# Patient Record
Sex: Female | Born: 1970 | Race: White | Hispanic: No | Marital: Married | Smoking: Never smoker
Health system: Southern US, Community
[De-identification: ages and names within clinical notes are randomized; demographics above are authoritative.]

## PROBLEM LIST (undated history)

## (undated) DIAGNOSIS — R739 Hyperglycemia, unspecified: Secondary | ICD-10-CM

## (undated) DIAGNOSIS — R251 Tremor, unspecified: Secondary | ICD-10-CM

## (undated) HISTORY — DX: Hyperglycemia, unspecified: R73.9

## (undated) HISTORY — PX: CHOLECYSTECTOMY: SHX55

## (undated) HISTORY — DX: Tremor, unspecified: R25.1

---

## 2000-05-13 ENCOUNTER — Other Ambulatory Visit: Admission: RE | Admit: 2000-05-13 | Discharge: 2000-05-13 | Payer: Self-pay | Admitting: Internal Medicine

## 2003-09-20 ENCOUNTER — Other Ambulatory Visit: Admission: RE | Admit: 2003-09-20 | Discharge: 2003-09-20 | Payer: Self-pay | Admitting: Internal Medicine

## 2003-09-29 ENCOUNTER — Encounter: Admission: RE | Admit: 2003-09-29 | Discharge: 2003-09-29 | Payer: Self-pay | Admitting: Internal Medicine

## 2004-04-20 ENCOUNTER — Other Ambulatory Visit: Admission: RE | Admit: 2004-04-20 | Discharge: 2004-04-20 | Payer: Self-pay | Admitting: Obstetrics and Gynecology

## 2004-05-16 ENCOUNTER — Ambulatory Visit: Payer: Self-pay | Admitting: Internal Medicine

## 2005-04-03 ENCOUNTER — Ambulatory Visit: Payer: Self-pay | Admitting: Internal Medicine

## 2005-05-09 ENCOUNTER — Other Ambulatory Visit: Admission: RE | Admit: 2005-05-09 | Discharge: 2005-05-09 | Payer: Self-pay | Admitting: Obstetrics and Gynecology

## 2005-10-31 ENCOUNTER — Inpatient Hospital Stay (HOSPITAL_COMMUNITY): Admission: AD | Admit: 2005-10-31 | Discharge: 2005-10-31 | Payer: Self-pay | Admitting: Obstetrics and Gynecology

## 2005-11-07 ENCOUNTER — Inpatient Hospital Stay (HOSPITAL_COMMUNITY): Admission: AD | Admit: 2005-11-07 | Discharge: 2005-11-10 | Payer: Self-pay | Admitting: Obstetrics and Gynecology

## 2006-12-08 ENCOUNTER — Ambulatory Visit: Payer: Self-pay | Admitting: Internal Medicine

## 2006-12-08 DIAGNOSIS — R519 Headache, unspecified: Secondary | ICD-10-CM | POA: Insufficient documentation

## 2006-12-08 DIAGNOSIS — R51 Headache: Secondary | ICD-10-CM | POA: Insufficient documentation

## 2006-12-08 DIAGNOSIS — R079 Chest pain, unspecified: Secondary | ICD-10-CM | POA: Insufficient documentation

## 2006-12-25 ENCOUNTER — Encounter: Admission: RE | Admit: 2006-12-25 | Discharge: 2006-12-25 | Payer: Self-pay | Admitting: Internal Medicine

## 2006-12-26 DIAGNOSIS — K8020 Calculus of gallbladder without cholecystitis without obstruction: Secondary | ICD-10-CM | POA: Insufficient documentation

## 2007-01-07 ENCOUNTER — Encounter: Payer: Self-pay | Admitting: Internal Medicine

## 2007-02-06 ENCOUNTER — Ambulatory Visit (HOSPITAL_COMMUNITY): Admission: RE | Admit: 2007-02-06 | Discharge: 2007-02-06 | Payer: Self-pay | Admitting: Surgery

## 2007-02-06 ENCOUNTER — Encounter (INDEPENDENT_AMBULATORY_CARE_PROVIDER_SITE_OTHER): Payer: Self-pay | Admitting: Surgery

## 2007-07-24 ENCOUNTER — Ambulatory Visit: Payer: Self-pay | Admitting: Internal Medicine

## 2007-07-24 LAB — CONVERTED CEMR LAB
Specific Gravity, Urine: 1.025
Urobilinogen, UA: 0.2
WBC Urine, dipstick: NEGATIVE

## 2007-07-26 LAB — CONVERTED CEMR LAB
ALT: 14 units/L (ref 0–35)
Albumin: 3.8 g/dL (ref 3.5–5.2)
Alkaline Phosphatase: 95 units/L (ref 39–117)
BUN: 13 mg/dL (ref 6–23)
Basophils Absolute: 0 10*3/uL (ref 0.0–0.1)
Basophils Relative: 0.2 % (ref 0.0–1.0)
CO2: 29 meq/L (ref 19–32)
Cholesterol: 162 mg/dL (ref 0–200)
Creatinine, Ser: 0.8 mg/dL (ref 0.4–1.2)
Eosinophils Relative: 0.7 % (ref 0.0–5.0)
GFR calc Af Amer: 104 mL/min
GFR calc non Af Amer: 86 mL/min
HCT: 41.1 % (ref 36.0–46.0)
Hemoglobin: 13.6 g/dL (ref 12.0–15.0)
MCHC: 33.1 g/dL (ref 30.0–36.0)
MCV: 87.5 fL (ref 78.0–100.0)
Monocytes Absolute: 0.5 10*3/uL (ref 0.1–1.0)
Monocytes Relative: 7.4 % (ref 3.0–12.0)
Neutrophils Relative %: 68.8 % (ref 43.0–77.0)
Platelets: 275 10*3/uL (ref 150–400)
RDW: 13.2 % (ref 11.5–14.6)
Total Protein: 7.2 g/dL (ref 6.0–8.3)
WBC: 6.1 10*3/uL (ref 4.5–10.5)

## 2007-08-04 ENCOUNTER — Encounter: Payer: Self-pay | Admitting: Internal Medicine

## 2007-08-04 ENCOUNTER — Other Ambulatory Visit: Admission: RE | Admit: 2007-08-04 | Discharge: 2007-08-04 | Payer: Self-pay | Admitting: Internal Medicine

## 2007-08-04 ENCOUNTER — Ambulatory Visit: Payer: Self-pay | Admitting: Internal Medicine

## 2007-09-30 ENCOUNTER — Ambulatory Visit: Payer: Self-pay | Admitting: Internal Medicine

## 2007-09-30 DIAGNOSIS — R42 Dizziness and giddiness: Secondary | ICD-10-CM | POA: Insufficient documentation

## 2007-12-31 IMAGING — US US ABDOMEN COMPLETE
1 series · 14 of 25 positions shown · non-contrast
Comparison: none

CLINICAL DATA: Epigastric pain. 
 ABDOMEN ULTRASOUND:
TECHNIQUE: Complete abdominal ultrasound examination was performed including evaluation of the liver, gallbladder, bile ducts, pancreas, kidneys, spleen, IVC, and abdominal aorta.

[Series 1: us abdomen complete · 0.20mm/px · 14 of 78 slices shown]
[im 1/78]
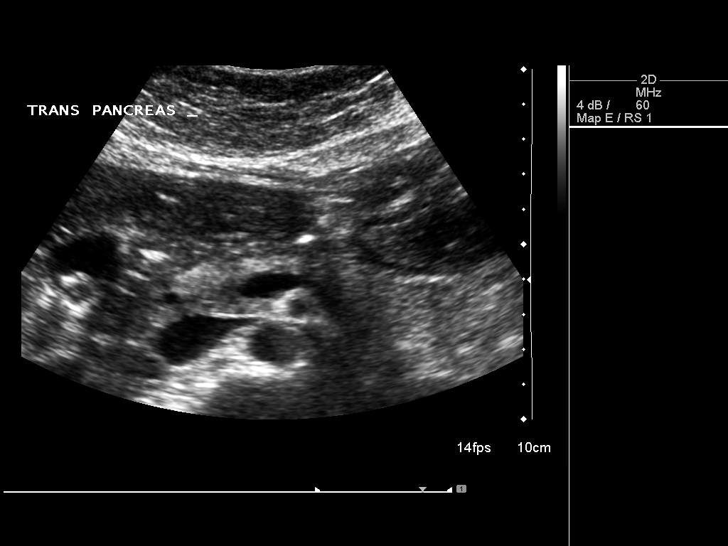
[im 7/78]
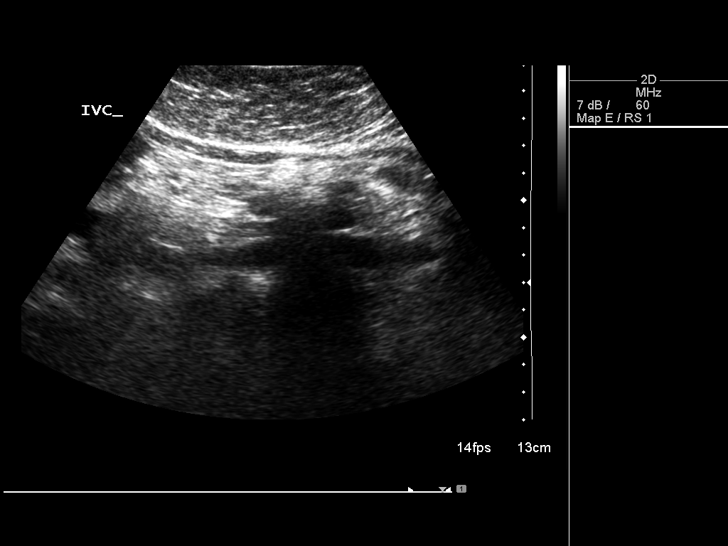
[im 13/78]
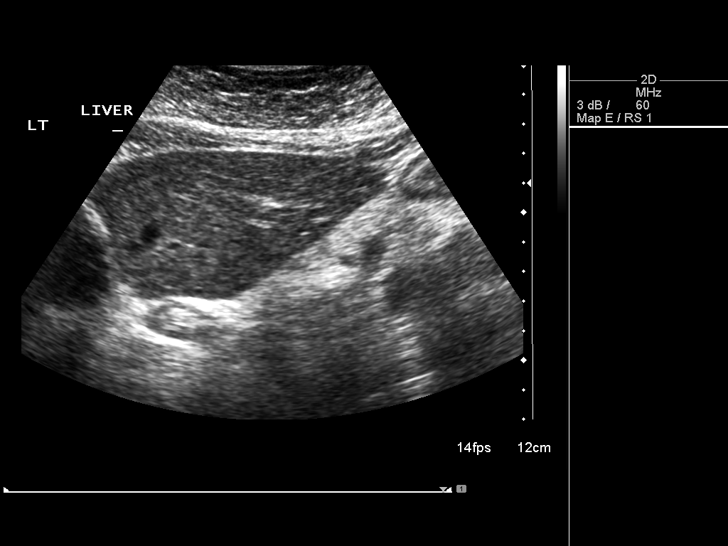
[im 20/78]
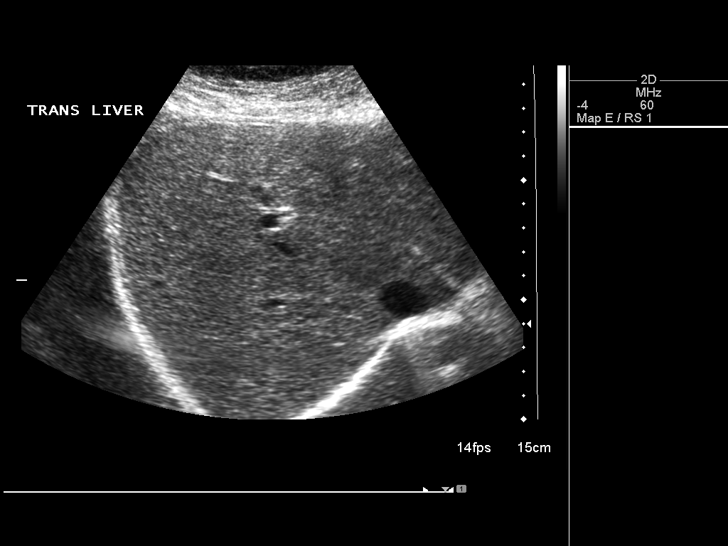
[im 26/78]
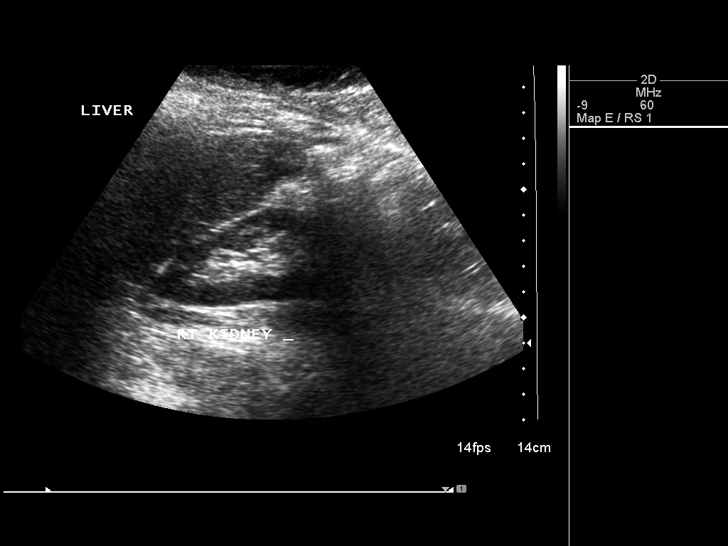
[im 29/78]
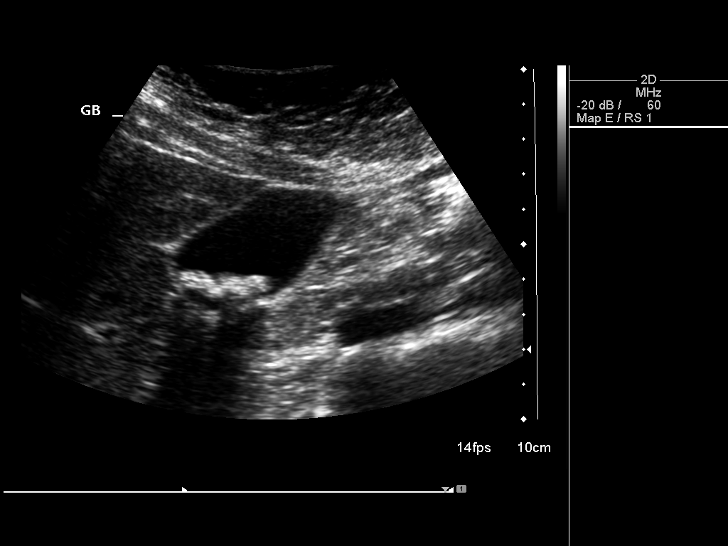
[im 36/78]
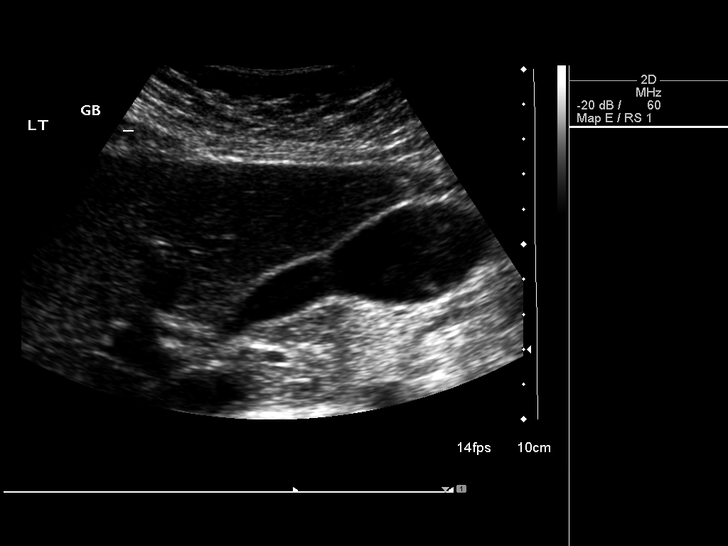
[im 42/78]
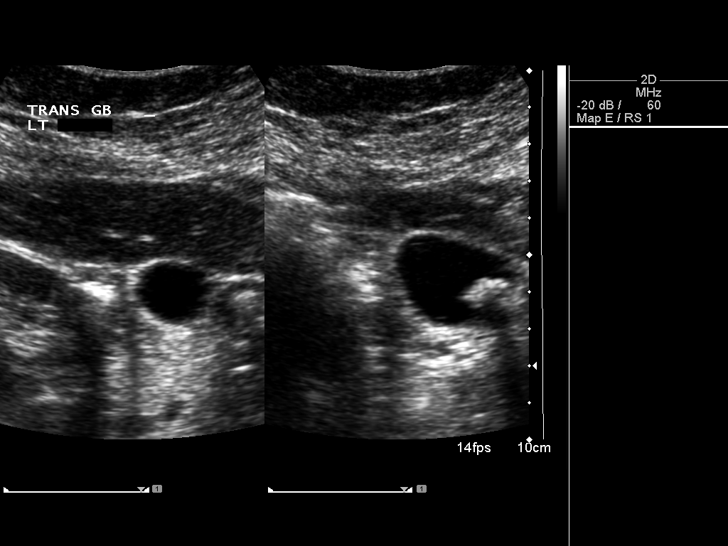
[im 49/78]
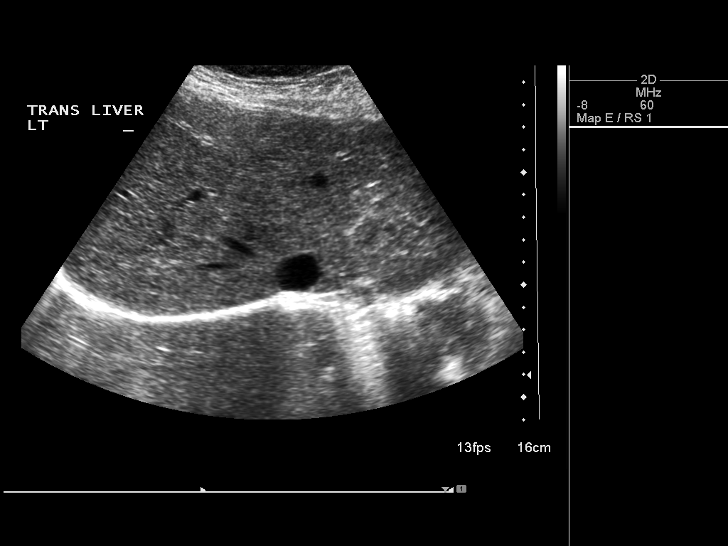
[im 52/78]
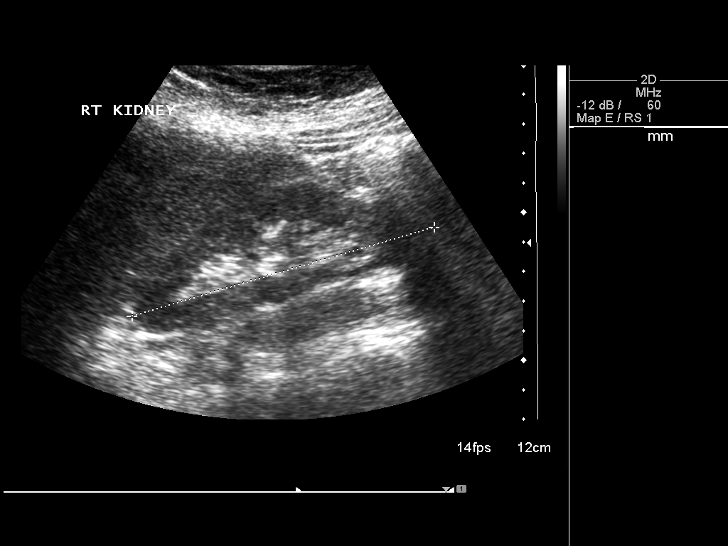
[im 58/78]
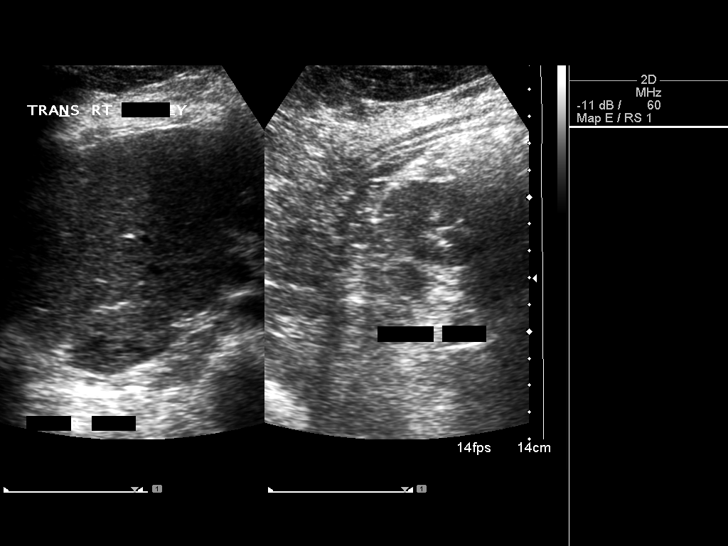
[im 65/78]
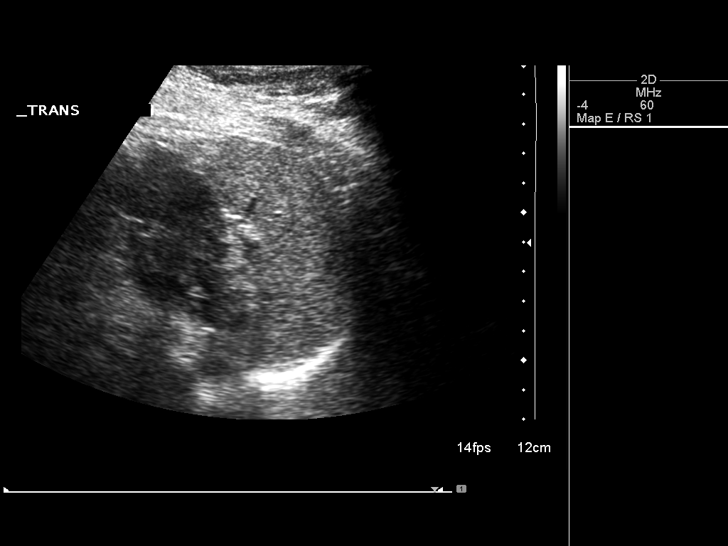
[im 71/78]
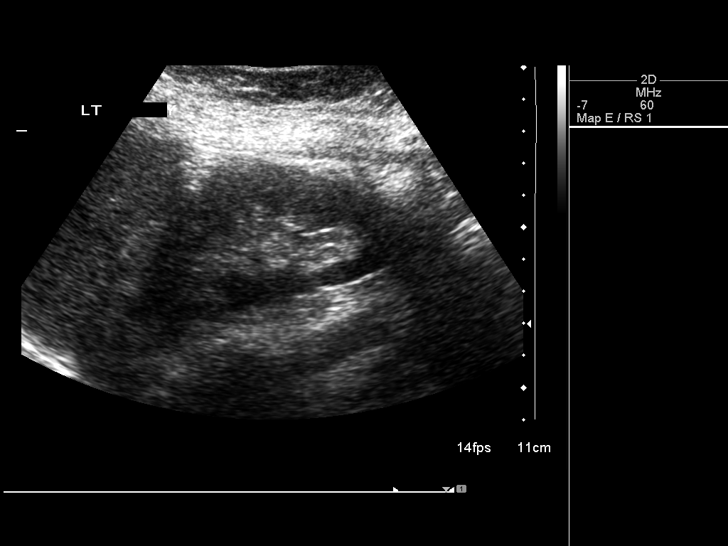
[im 78/78]
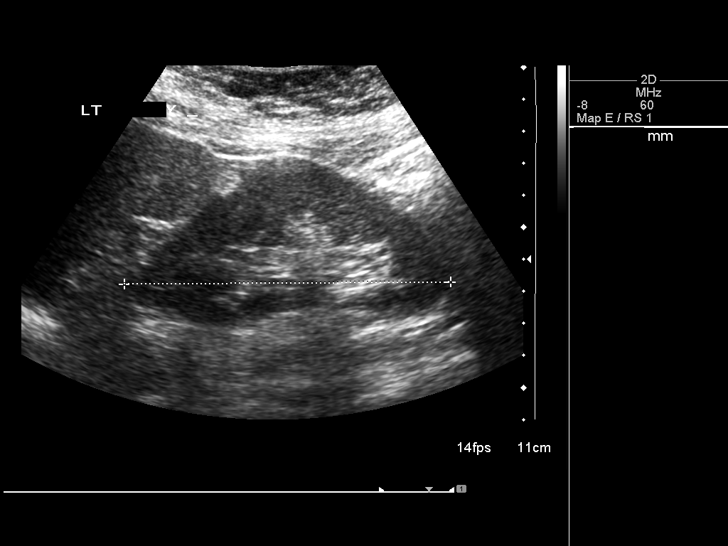

[14 of 25 positions shown; findings below may reference images not displayed]

FINDINGS: The gallbladder is well-visualized and multiple gallstones are present with shadowing.  The liver has a normal echogenic pattern.  The common bile duct is normal measuring 4.3 mm.  The IVC, pancreas and spleen appear normal.  No hydronephrosis is seen.  The right kidney measures 10.7 cm sagittally with the left kidney measuring 10.2 cm.  The abdominal aorta is normal in caliber.
IMPRESSION: Multiple gallstones.  No gallbladder wall thickening or ductal dilatation.

## 2010-06-01 ENCOUNTER — Encounter: Payer: Self-pay | Admitting: Internal Medicine

## 2010-06-01 ENCOUNTER — Ambulatory Visit (INDEPENDENT_AMBULATORY_CARE_PROVIDER_SITE_OTHER): Payer: BC Managed Care – PPO | Admitting: Internal Medicine

## 2010-06-01 VITALS — BP 130/80 | HR 66 | Temp 98.6°F | Wt 152.0 lb

## 2010-06-01 DIAGNOSIS — M79671 Pain in right foot: Secondary | ICD-10-CM | POA: Insufficient documentation

## 2010-06-01 DIAGNOSIS — M79609 Pain in unspecified limb: Secondary | ICD-10-CM

## 2010-06-01 MED ORDER — HYDROCODONE-ACETAMINOPHEN 5-500 MG PO TABS: 1.0000 | ORAL_TABLET | Freq: Four times a day (QID) | ORAL | Status: DC | PRN

## 2010-06-01 MED ORDER — DICLOFENAC SODIUM 50 MG PO TBEC: DELAYED_RELEASE_TABLET | ORAL | Status: DC

## 2010-06-01 NOTE — Progress Notes (Signed)
  Subjective:    Patient ID: Lauren Owens, female    DOB: 07/21/1970, 40 y.o.   MRN: [id number]  Foot Pain    Pt presents to clinic for evaluation of foot pain. Notes 2 week h/o right distal foot pain after striking toes against hard plastic toy on floor.  Pain resolves but recurrs after standing or walking during the day and is associated with soft tissue swelling distal dorsal foot. Notes tenderness along toes and plantar foot. Denies loss of sensation. Attempted otc nsaid without significant improvement. Denies h/o PUD or CRI. No other complaint.  Reviewed PMH, medications and allergies    Review of Systems see HPI     Objective:   Physical Exam  Constitutional: She appears well-developed and well-nourished. No distress.  HENT:  Head: Normocephalic and atraumatic.  Right Ear: External ear normal.  Left Ear: External ear normal.  Musculoskeletal:       Tenderness along proximal 2nd/4th toes and plantar 2/4th metatarsal heads. FROM and sensation noted. No bony abn noted  Skin: Skin is warm and dry. No rash noted. She is not diaphoretic. No erythema.          Assessment & Plan:

## 2010-06-01 NOTE — Assessment & Plan Note (Signed)
Obtain plain radiograph right foot. Light weight bearing only until xray results known. Attempt diclofenac with food and no other nsaids. Lortab prn pain short term and cautioned re: possible sedating effect.

## 2010-06-05 ENCOUNTER — Telehealth: Payer: Self-pay

## 2010-06-05 NOTE — Telephone Encounter (Signed)
Ok to wait if significantly improving

## 2010-06-05 NOTE — Telephone Encounter (Signed)
Pt states that she forgot to go get X-ray done but notes that anti-inflammatory that was rx'ed is working for her. States that swelling is almost completely gone. Wants to know if she should get x-ray done anyway. Please advise.

## 2010-06-05 NOTE — Telephone Encounter (Signed)
Pt aware.

## 2010-08-14 NOTE — Op Note (Signed)
NAMEDORITA, Lauren Owens             ACCOUNT NO.:  [id number]   MEDICAL RECORD NO.:  [id number]          PATIENT TYPE:  AMB   LOCATION:  DAY                          FACILITY:  [facility]   PHYSICIAN:  Thornton Park. Daphine Deutscher, MD  DATE OF BIRTH:  10/02/1970   DATE OF PROCEDURE:  02/06/2007  DATE OF DISCHARGE:                               OPERATIVE REPORT   PREOPERATIVE DIAGNOSIS:  Biliary colic with gallstones.   POSTOPERATIVE DIAGNOSIS:  Biliary colic with gallstones.   PROCEDURE:  Laparoscopic cholecystectomy with intraoperative  cholangiogram.   SURGEON:  Thornton Park. Daphine Deutscher, M.D.   ASSISTANT:  Ardeth Sportsman, M.D.   ANESTHESIA:  General endotracheal.   DESCRIPTION OF PROCEDURE:  Lauren Owens was taken to Room 1 and given  general anesthesia.  The abdomen was prepped with Techni-Care and draped  sterilely.  Access to the abdomen was gained through the umbilicus  making a longitudinal incision and then going through a small umbilical  hernia that was present.  Abdomen was inflated, and the usual trocars  were placed in the upper abdomen.  The gallbladder was grasped and  elevated.  She had numerous adhesions to the omentum and duodenum stuck  to her gallbladder fundus as well as infundibulum.  These were taken  away with sharp dissection, and down near the duodenum, I did put a clip  across some of these to minimize electrocautery.  I mobilized the  infundibulum, and then I dissected free Calot's triangle.  I put a clip  upon the gallbladder and incised the cystic duct.  She had a dynamic  cholangiogram.  She had what appears to be a bubble coalescing in the  common duct but no  biliary dilatation and free flow of the duodenum.  Triple clipped cystic duct, divided it; triple clipped cystic artery,  divided it; and then removed the gallbladder from the gallbladder bed  with hook electrocautery without entering it.  The gallbladder was then  put a bag and brought out through the  umbilicus.  We irrigated, and no  bleeding was seen.  The umbilical port was closed with a single 0 Vicryl  under laparoscopic vision, and then ports were all injected with some  1/2% Marcaine.  The wounds were closed with 4-0 Vicryl.  She was  awakened and taken to recovery room in satisfactory condition.      Thornton Park Daphine Deutscher, MD  Electronically Signed     MBM/MEDQ  D:  02/06/2007  T:  02/07/2007  Job:  [id number]

## 2010-08-17 NOTE — Discharge Summary (Signed)
Lauren Owens, Lauren Owens             ACCOUNT NO.:  [id number]   MEDICAL RECORD NO.:  [id number]          PATIENT TYPE:  INP   LOCATION:  9143                          FACILITY:  [facility]   PHYSICIAN:  Juluis Mire, M.D.   DATE OF BIRTH:  08-16-1970   DATE OF ADMISSION:  11/07/2005  DATE OF DISCHARGE:  11/10/2005                                 DISCHARGE SUMMARY   ADMITTING DIAGNOSES:  1. Intrauterine pregnancy at term.  2. Breech presentation.  3. Early onset of labor.   DISCHARGE DIAGNOSES:  1. Status post low transverse cesarean section.  2. Viable female infant.   PROCEDURE:  Low transverse cesarean section.   REASON FOR ADMISSION:  Please see written H&P.   HOSPITAL COURSE:  The patient is a 40 year old gravida 2, para 1 who was  admitted to [facility] at 37-1/7 weeks estimated  gestational age for a cesarean delivery.  The patient had a known breech  presentation and had presented into the office in early labor.  The patient  was noted to be 4 cm dilated.  The patient was then admitted to [facility] for a scheduled cesarean section.  The patient was then  transferred to the operating room where spinal anesthesia was administered  without difficulty.  A low transverse incision was made with delivery of a  viable female infant weighing 7 pounds 4 ounces, Apgars of 8 at one minute  and 9 at five minutes.  The patient tolerated the procedure well and was  taken to the recovery room in stable condition.   On postoperative day #1, the patient was without complaint.  Vital signs  were stable.  Fundus was firm and nontender.  Abdomen soft with good return  of bowel function.  Abdominal dressing was noted to be clean, dry and  intact.  Laboratory findings revealed hemoglobin of 8.1.   On postoperative day #2, the patient was without complaint.  Vital signs  were stable.  Fundus was firm and nontender.  Abdominal dressing had been  removed revealing  an incision that was clean, dry and intact.  The patient  was ambulating well, tolerating a regular diet, without complaints of nausea  and vomiting.   On postoperative day #3, the patient was without complaint.  Vital signs  remained stable.  She was afebrile.  Fundus was firm and nontender.  Incision was clean, dry and intact.  Staples were removed, and the patient  was discharged home.   CONDITION ON DISCHARGE:  Good.   DIET:  Regular as tolerated.   ACTIVITY:  No heavy lifting, no driving x2 weeks, no vaginal entry.   FOLLOW UP:  Patient to follow up in the office in 2 weeks for an incision  check.  She is to call for temperature greater than 100 degrees, persistent  nausea or vomiting, heavy vaginal bleeding and/or redness or drainage from  the incisional site.   DISCHARGE MEDICATIONS:  1. Tylox, #30, one p.o. every 4-6 hours p.r.n.  2. Motrin 600 mg every 6 hours.  3. Prenatal vitamins one p.o. daily.  4. Colace  one p.o. daily p.r.n.      Julio Sicks, N.P.      Juluis Mire, M.D.  Electronically Signed    CC/MEDQ  D:  12/06/2005  T:  12/06/2005  Job:  [id number]

## 2010-08-17 NOTE — Op Note (Signed)
NAMEJESSIA, Lauren Owens             ACCOUNT NO.:  [id number]   MEDICAL RECORD NO.:  [id number]          PATIENT TYPE:  INP   LOCATION:  9143                          FACILITY:  [facility]   PHYSICIAN:  Michelle L. Grewal, M.D.DATE OF BIRTH:  12-Jan-1971   DATE OF PROCEDURE:  11/07/2005  DATE OF DISCHARGE:                                 OPERATIVE REPORT   PREOPERATIVE DIAGNOSES:  1. Intrauterine pregnancy at 37-1/7 weeks.  2. Breech.  3. Early labor.   POSTOPERATIVE DIAGNOSES:  1. Intrauterine pregnancy at 37-1/7 weeks.  2. Breech.  3. Early labor.   PROCEDURE:  Primary low transverse cesarean section.   SURGEON:  Dr. Vincente Poli   ANESTHESIA:  Spinal.   SPECIMENS:  Female infant in frank breech presentation.  Apgars 8 at one  minute and 9 at five minutes, weighing 7 pounds 4 ounces.   ESTIMATED BLOOD LOSS:  500 mL.   COMPLICATIONS:  None.   PROCEDURE:  The patient was taken to the operating room.  She was dosed, and  her epidural was placed by Dr. Tacy Dura.  She was then placed in the supine  position with a leftward tilt.  She was prepped and draped in the usual  sterile fashion, and a Foley catheter was inserted.  A low transverse  incision was made and carried down to the fascia, fascia scored in the  midline, and the rectus muscles were separated in the midline.  The  peritoneum was entered bluntly.  The peritoneal incision was then stretched.  The bladder blade was inserted.  The lower uterine segment was identified.  The bladder flap was created sharply and then digitally, and the bladder  blade was readjusted.  A low transverse incision was made in the uterus.  The uterus was entered using the hemostat.  There was a lot of amniotic  fluid which was clear.  The baby was showing frank breech presentation, was  delivered quite easily via complete breech extraction.  The baby was noted  to have a segment of the cord in its mouth at the time of delivery.  The  baby had Apgars 8 at  one minute 9 at five minutes and weighed 7 pounds 4  ounces.  The cord was clamped and cut, and the baby was handed to the  waiting neonatologist.  The cord blood was obtained.  The placenta was  manually removed and noted to be normal intact with three-vessel cord.  The  uterus was exteriorized and cleared of all clots and debris after the  placenta was delivered.  The uterine incision was closed in 1 layer using a  chromic continuous running stitch with a hemostat.  The adnexa were normal.  The uterus was returned to the abdomen.  Irrigation was performed.  The  peritoneum was closed using 0 Vicryl.  The rectus muscles were  reapproximated using 0 Vicryl.  The fascia was closed using 0 Vicryl in  continuous running stitch x2, starting at  each corner and meeting in the midline.  After irrigation of the  subcutaneous layer and noting hemostasis, the skin was closed with staples.  All sponge, lap, and instrument counts were correct x2.  The patient went to  recovery room in stable condition.      Michelle L. Vincente Poli, M.D.  Electronically Signed     MLG/MEDQ  D:  11/07/2005  T:  11/07/2005  Job:  [id number]

## 2011-01-08 LAB — PREGNANCY, URINE: Preg Test, Ur: NEGATIVE

## 2011-01-08 LAB — HEMOGLOBIN AND HEMATOCRIT, BLOOD
HCT: 37.6
Hemoglobin: 12.8

## 2011-03-29 ENCOUNTER — Encounter: Payer: Self-pay | Admitting: Internal Medicine

## 2011-04-16 ENCOUNTER — Other Ambulatory Visit (INDEPENDENT_AMBULATORY_CARE_PROVIDER_SITE_OTHER): Payer: BC Managed Care – PPO

## 2011-04-16 DIAGNOSIS — Z0000 Encounter for general adult medical examination without abnormal findings: Secondary | ICD-10-CM

## 2011-04-16 LAB — POCT URINALYSIS DIPSTICK
Bilirubin, UA: NEGATIVE
Blood, UA: NEGATIVE
Glucose, UA: NEGATIVE
Ketones, UA: NEGATIVE
Nitrite, UA: NEGATIVE
Protein, UA: NEGATIVE
Spec Grav, UA: <=1.005
Urobilinogen, UA: 0.2
pH, UA: 5.5

## 2011-04-16 LAB — CBC WITH DIFFERENTIAL/PLATELET
Basophils Absolute: 0 K/uL (ref 0.0–0.1)
Basophils Relative: 0.3 % (ref 0.0–3.0)
Eosinophils Absolute: 0.1 K/uL (ref 0.0–0.7)
Eosinophils Relative: 0.7 % (ref 0.0–5.0)
HCT: 40.1 % (ref 36.0–46.0)
Hemoglobin: 13.7 g/dL (ref 12.0–15.0)
Lymphocytes Relative: 23.3 % (ref 12.0–46.0)
Lymphs Abs: 1.8 K/uL (ref 0.7–4.0)
MCHC: 34.1 g/dL (ref 30.0–36.0)
MCV: 88.3 fl (ref 78.0–100.0)
Monocytes Absolute: 0.6 K/uL (ref 0.1–1.0)
Monocytes Relative: 7.6 % (ref 3.0–12.0)
Neutro Abs: 5.3 K/uL (ref 1.4–7.7)
Neutrophils Relative %: 68.1 % (ref 43.0–77.0)
Platelets: 273 K/uL (ref 150.0–400.0)
RBC: 4.55 Mil/uL (ref 3.87–5.11)
RDW: 14 % (ref 11.5–14.6)
WBC: 7.8 K/uL (ref 4.5–10.5)

## 2011-04-16 LAB — BASIC METABOLIC PANEL
BUN: 11 mg/dL (ref 6–23)
Calcium: 9.3 mg/dL (ref 8.4–10.5)
Chloride: 107 mEq/L (ref 96–112)
Creatinine, Ser: 0.8 mg/dL (ref 0.4–1.2)
Glucose, Bld: 88 mg/dL (ref 70–99)
Sodium: 140 mEq/L (ref 135–145)

## 2011-04-16 LAB — HEPATIC FUNCTION PANEL
ALT: 14 U/L (ref 0–35)
Albumin: 4 g/dL (ref 3.5–5.2)
Total Protein: 7.5 g/dL (ref 6.0–8.3)

## 2011-04-16 LAB — TSH: TSH: 1.36 u[IU]/mL (ref 0.35–5.50)

## 2011-04-16 LAB — LIPID PANEL: LDL Cholesterol: 87 mg/dL (ref 0–99)

## 2011-04-23 ENCOUNTER — Ambulatory Visit (INDEPENDENT_AMBULATORY_CARE_PROVIDER_SITE_OTHER): Payer: BC Managed Care – PPO | Admitting: Internal Medicine

## 2011-04-23 ENCOUNTER — Encounter: Payer: Self-pay | Admitting: Internal Medicine

## 2011-04-23 VITALS — BP 120/80 | HR 66 | Ht 62.25 in | Wt 152.0 lb

## 2011-04-23 DIAGNOSIS — R5381 Other malaise: Secondary | ICD-10-CM

## 2011-04-23 DIAGNOSIS — Z0000 Encounter for general adult medical examination without abnormal findings: Secondary | ICD-10-CM | POA: Insufficient documentation

## 2011-04-23 DIAGNOSIS — R5383 Other fatigue: Secondary | ICD-10-CM

## 2011-04-23 DIAGNOSIS — G43829 Menstrual migraine, not intractable, without status migrainosus: Secondary | ICD-10-CM

## 2011-04-23 MED ORDER — SUMATRIPTAN SUCCINATE 100 MG PO TABS: 100.0000 mg | ORAL_TABLET | ORAL | Status: DC | PRN

## 2011-04-23 NOTE — Patient Instructions (Addendum)

## 2011-04-23 NOTE — Progress Notes (Signed)
Subjective:    Patient ID: Lauren Owens, female    DOB: March 23, 1971, 41 y.o.   MRN: [id number]  HPI Patient comes in today for Preventive Health Care visit  Since last visit : Periods reg off ocps   Heavier  And last   5 days or so  Gets ha at beginning and some at end  And takes  Acetomeophen.  And sometimes goodies.  Problematic  No vision changes.  Has been going on for a while  On period today  Review of Systems ROS: tired   7.5 hours.  Of sleep. GEN/ HEENTNo fever, significant weight changes sweats vision problems hearing changes, CV/ PULM; No chest pain shortness of breath cough, syncope,edema  change in exercise tolerance. GI /GU: No adominal pain, vomiting, change in bowel habits. No blood in the stool. No significant GU symptoms. SKIN/HEME: ,no acute skin rashes suspicious lesions or bleeding. No lymphadenopathy, nodules, masses.  NEURO/ PSYCH:  No neurologic signs such as weakness numbness No depression anxiety.  Tremors pretty much reolved with outside lifestyle IMM/ Allergy: No unusual infections.  Allergy .   REST of 12 system review negative  Past Medical History  Diagnosis Date  . Migraine   . Physiological tremor     exagerated  . Blood glucose elevated     borderline    History   Social History  . Marital Status: Married    Spouse Name: N/A    Number of Children: N/A  . Years of Education: N/A   Occupational History  . Not on file.   Social History Main Topics  . Smoking status: Never Smoker   . Smokeless tobacco: Not on file  . Alcohol Use: No  . Drug Use: Not on file  . Sexually Active: Not on file   Other Topics Concern  . 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  Has a farm and blended family2 dog and GPDrinks milk and lots of caffeineNot regular exerciseSleep 5 hours    Past Surgical History  Procedure Date  . Cesarean section   .  Cholecystectomy     Family History  Problem Relation Age of Onset  . Hypertension    . Gallbladder disease Mother   . Colon cancer    . Lung cancer    . Coronary artery disease Father     in his 4's cabg x 5    No Known Allergies  No current outpatient prescriptions on file prior to visit.    BP 120/80  Pulse 66  Ht 5' 2.25" (1.581 m)  Wt 152 lb (68.947 kg)  BMI 27.58 kg/m2  LMP 04/23/2011        Objective:   Physical Exam Physical Exam: Vital signs reviewed WUJ:WJXB is a well-developed well-nourished alert cooperative  white female who appears her stated age in no acute distress.  HEENT: normocephalic atraumatic , Eyes: PERRL EOM's full, conjunctiva clear, Nares: paten,t no deformity discharge or tenderness., Ears: no deformity EAC's clear TMs with normal landmarks. Mouth: clear OP, no lesions, edema.  Moist mucous membranes. NECK: supple without masses, thyromegaly or bruits. CHEST/PULM:  Clear to auscultation and percussion breath sounds equal no wheeze , rales or rhonchi. No chest wall deformities or tenderness. Breast: normal by inspection . No dimpling, discharge, masses, tenderness or discharge . LN: no cervical axillary inguinal adenopathy CV: PMI is nondisplaced, S1 S2  no gallops, murmurs, rubs. Peripheral pulses are full without delay.No JVD .  ABDOMEN: Bowel sounds normal nontender  No guard or rebound, no hepato splenomegal no CVA tenderness.  No hernia. Extremtities:  No clubbing cyanosis or edema, no acute joint swelling or redness no focal atrophy NEURO:  Oriented x3, cranial nerves 3-12 appear to be intact, no obvious focal weakness,gait within normal limits no abnormal reflexes or asymmetrical no tremor today  SKIN: No acute rashes normal turgor, color, no bruising or petechiae. PSYCH: Oriented, good eye contact, no obvious depression anxiety, cognition and judgment appear normal. LN: no cervical axillary inguinal adenopathy    Lab Results  Component  Value Date   WBC 7.8 04/16/2011   HGB 13.7 04/16/2011   HCT 40.1 04/16/2011   PLT 273.0 04/16/2011   GLUCOSE 88 04/16/2011   CHOL 155 04/16/2011   TRIG 60.0 04/16/2011   HDL 56.40 04/16/2011   LDLCALC 87 04/16/2011   ALT 14 04/16/2011   AST 17 04/16/2011   NA 140 04/16/2011   K 4.0 04/16/2011   CL 107 04/16/2011   CREATININE 0.8 04/16/2011   BUN 11 04/16/2011   CO2 23 04/16/2011   TSH 1.36 04/16/2011   ua   Had vag irritation none now  And no uti sx  On menses today     Assessment & Plan:  Preventive Health Care Counseled regarding healthy nutrition, exercise, sleep, injury prevention, calcium vit d and healthy weight .  Fatigue prob LS  Will follow Counseled.  Menstrual migraines    Disc options  Aleve 2  And then  Can try imitrex.    Risk benefit of medication discussed.  Fu after next menses for HA  Fu and also can do pap at that time

## 2011-04-27 ENCOUNTER — Encounter: Payer: Self-pay | Admitting: Internal Medicine

## 2011-04-27 DIAGNOSIS — R5383 Other fatigue: Secondary | ICD-10-CM | POA: Insufficient documentation

## 2011-09-06 ENCOUNTER — Encounter: Payer: Self-pay | Admitting: Internal Medicine

## 2011-09-06 ENCOUNTER — Other Ambulatory Visit (HOSPITAL_COMMUNITY)
Admission: RE | Admit: 2011-09-06 | Discharge: 2011-09-06 | Disposition: A | Discharge status: Home / Self Care | Payer: BC Managed Care – PPO | Source: Ambulatory Visit | Attending: Internal Medicine | Admitting: Internal Medicine

## 2011-09-06 ENCOUNTER — Ambulatory Visit (INDEPENDENT_AMBULATORY_CARE_PROVIDER_SITE_OTHER): Payer: BC Managed Care – PPO | Admitting: Internal Medicine

## 2011-09-06 VITALS — BP 132/84 | HR 76 | Temp 98.7°F | Wt 150.0 lb

## 2011-09-06 DIAGNOSIS — N946 Dysmenorrhea, unspecified: Secondary | ICD-10-CM

## 2011-09-06 DIAGNOSIS — N760 Acute vaginitis: Secondary | ICD-10-CM | POA: Insufficient documentation

## 2011-09-06 DIAGNOSIS — Z01419 Encounter for gynecological examination (general) (routine) without abnormal findings: Secondary | ICD-10-CM | POA: Insufficient documentation

## 2011-09-06 DIAGNOSIS — R10819 Abdominal tenderness, unspecified site: Secondary | ICD-10-CM

## 2011-09-06 DIAGNOSIS — Z3009 Encounter for other general counseling and advice on contraception: Secondary | ICD-10-CM

## 2011-09-06 MED ORDER — FLUCONAZOLE 150 MG PO TABS: 150.0000 mg | ORAL_TABLET | Freq: Once | ORAL | Status: AC

## 2011-09-06 NOTE — Progress Notes (Signed)
  Subjective:    Patient ID: Lauren Owens, female    DOB: 08/10/1970, 41 y.o.   MRN: [id number]  HPI Patient comes in today for  new problem evaluation.  Onset 5 days of vaginal pain discharge and dyspareunia? No fever but some lower abd tenderness USED VAGISIL   wipe and   Itch cream.  And no help  Had burning itching  And discharge.   Since 3 days ago not as much discharge and less itching..  No rx     7 year  Partner  Low risk  No uti sx . Has gyne  Dr Vincente Poli. No recent antibiotic  Rash. Has some lower abd tenderness without other sx  Period due next week..   No contraception.     Reg menses.  To see dr Vincente Poli. Gets se of ocps  .  Periods reg but with cramps getting worse with age.  lmp 3 weeks ago  Review of Systems Neg fever chills cp sob flank pain.   Past history family history social history reviewed in the electronic medical record. Remote hx of chlamydia years ago  No abn pap .     Objective:   Physical Exam BP 132/84  Pulse 76  Temp(Src) 98.7 F (37.1 C) (Oral)  Wt 150 lb (68.04 kg)  SpO2 98%  LMP 08/18/2011 WDWN in nad Abdomen:  Sof,t normal bowel sounds without hepatosplenomegaly, no guarding rebound or masses no CVA tenderness  Minim tenderness lower abd pelvic area  Pelvic: NL ext GU, labia clear without lesions very red  No ulcers  . Vagina no lesions red 1+ cottage cheese type dc yellow and white  No ulcers sx clear .Cervix: clear  UTERUS: Neg CMT Adnexa:  clear no masses mildly tender . PAP done with vaginal testing  evaluation     Assessment & Plan:  vulvo vaginitis  Poss yeast. Mild abd tenderness could be premenstrual  Pap done  Contraceptive needs with hx of se of ocps and dysmenorrhea.  treat for yeast   Get appt with her gyne to discuss contraceptive alternatives. Contact us if  persistent or progressive  Total visit > 50% spent counseling and coordinating care

## 2011-09-06 NOTE — Patient Instructions (Signed)
This seems like a yeast infection. However will let you know when labs are back and get results to Dr.  Vincente Poli. Take yeast cream  And can add oral rx if needed. Consider IUD   For contraception.     Monilial Vaginitis Vaginitis in a soreness, swelling and redness (inflammation) of the vagina and vulva. Monilial vaginitis is not a sexually transmitted infection. CAUSES  Yeast vaginitis is caused by yeast (candida) that is normally found in your vagina. With a yeast infection, the candida has overgrown in number to a point that upsets the chemical balance. SYMPTOMS   White, thick vaginal discharge.   Swelling, itching, redness and irritation of the vagina and possibly the lips of the vagina (vulva).   Burning or painful urination.   Painful intercourse.  DIAGNOSIS  Things that may contribute to monilial vaginitis are:  Postmenopausal and virginal states.   Pregnancy.   Infections.   Being tired, sick or stressed, especially if you had monilial vaginitis in the past.   Diabetes. Good control will help lower the chance.   Birth control pills.   Tight fitting garments.   Using bubble bath, feminine sprays, douches or deodorant tampons.   Taking certain medications that kill germs (antibiotics).   Sporadic recurrence can occur if you become ill.  TREATMENT  Your caregiver will give you medication.  There are several kinds of anti monilial vaginal creams and suppositories specific for monilial vaginitis. For recurrent yeast infections, use a suppository or cream in the vagina 2 times a week, or as directed.   Anti-monilial or steroid cream for the itching or irritation of the vulva may also be used. Get your caregiver's permission.   Painting the vagina with methylene blue solution may help if the monilial cream does not work.   Eating yogurt may help prevent monilial vaginitis.  HOME CARE INSTRUCTIONS   Finish all medication as prescribed.   Do not have sex until  treatment is completed or after your caregiver tells you it is okay.   Take warm sitz baths.   Do not douche.   Do not use tampons, especially scented ones.   Wear cotton underwear.   Avoid tight pants and panty hose.   Tell your sexual partner that you have a yeast infection. They should go to their caregiver if they have symptoms such as mild rash or itching.   Your sexual partner should be treated as well if your infection is difficult to eliminate.   Practice safer sex. Use condoms.   Some vaginal medications cause latex condoms to fail. Vaginal medications that harm condoms are:   Cleocin cream.   Butoconazole (Femstat).   Terconazole (Terazol) vaginal suppository.   Miconazole (Monistat) (may be purchased over the counter).  SEEK MEDICAL CARE IF:   You have a temperature by mouth above 102 F (38.9 C).   The infection is getting worse after 2 days of treatment.   The infection is not getting better after 3 days of treatment.   You develop blisters in or around your vagina.   You develop vaginal bleeding, and it is not your menstrual period.   You have pain when you urinate.   You develop intestinal problems.   You have pain with sexual intercourse.  Document Released: 12/26/2004 Document Revised: 03/07/2011 Document Reviewed: 09/09/2008 [facility] Patient Information 2012 [place], [place].  Vaginitis Vaginitis is an infection. It causes soreness, swelling, and redness (inflammation) of the vagina. Many of these infections are sexually transmitted diseases (  STDs). Having unprotected sex can cause further problems and complications such as:  Chronic pelvic pain.   Infertility.   Unwanted pregnancy.   Abortion.   Tubal pregnancy.   Infection passed on to the newborn.   Cancer.  CAUSES   Monilia. This is a yeast or fungus infection, not an STD.   Bacterial vaginosis. The normal balance of bacteria in the vagina is disrupted and is replaced by an  overgrowth of certain bacteria.   Gonorrhea, chlamydia. These are bacterial infections that are STDs.   Vaginal sponges, diaphragms, and intrauterine devices.   Trichomoniasis. This is a STD infection caused by a parasite.   Viruses like herpes and human papillomavirus. Both are STDs.   Pregnancy.   Immunosuppression. This occurs with certain conditions such as HIV infection or cancer.   Using bubble bath.   Taking certain antibiotic medicines.   Sporadic recurrence can occur if you become sick.   Diabetes.   Steroids.   Allergic reaction. If you have an allergy to:   Douches.   Soaps.   Spermicides.   Condoms.   Scented tampons or vaginal sprays.  SYMPTOMS   Abnormal vaginal discharge.   Itching of the vagina.   Pain in the vagina.   Swelling of the vagina.  In some cases, there are no symptoms. TREATMENT  Treatment will vary depending on the type of infection.  Bacteria or trichomonas are usually treated with oral antibiotics and sometimes vaginal cream or suppositories.   Monilia vaginitis is usually treated with vaginal creams, suppositories, or oral antifungal pills.   Viral vaginitis has no cure. However, the symptoms of herpes (a viral vaginitis) can be treated to relieve the discomfort. Human papillomavirus has no symptoms. However, there are treatments for the diseases caused by human papillomavirus.   With allergic vaginitis, you need to stop using the product that is causing the problem. Vaginal creams can be used to treat the symptoms.   When treating an STD, the sex partner should also be treated.  HOME CARE INSTRUCTIONS   Take all the medicines as directed by your caregiver.   Do not use scented tampons, soaps, or vaginal sprays.   Do not douche.   Tell your sex partner if you have a vaginal infection or an STD.   Do not have sexual intercourse until you have treated the vaginitis.   Practice safe sex by using condoms.  SEEK MEDICAL  CARE IF:   You have abdominal pain.   Your symptoms get worse during treatment.  Document Released: 01/13/2007 Document Revised: 03/07/2011 Document Reviewed: 09/08/2008 [facility] Patient Information 2012 [place], [place].

## 2011-09-20 NOTE — Progress Notes (Signed)
Quick Note:  Tell patient PAP is normal. But she does have BV Bacteria and some yeast. If she is still having symptoms  Would treat with flagyl 500mg  po bid for 7 days Disp 14 in addition to her yeast treatment. Please send in rx To her pharmacy if she wishes. ______

## 2012-09-15 ENCOUNTER — Ambulatory Visit (INDEPENDENT_AMBULATORY_CARE_PROVIDER_SITE_OTHER): Payer: BC Managed Care – PPO | Admitting: Internal Medicine

## 2012-09-15 ENCOUNTER — Encounter: Payer: Self-pay | Admitting: Internal Medicine

## 2012-09-15 VITALS — BP 142/90 | HR 86 | Temp 97.9°F | Wt 156.0 lb

## 2012-09-15 DIAGNOSIS — L089 Local infection of the skin and subcutaneous tissue, unspecified: Secondary | ICD-10-CM

## 2012-09-15 DIAGNOSIS — A499 Bacterial infection, unspecified: Secondary | ICD-10-CM

## 2012-09-15 DIAGNOSIS — B9689 Other specified bacterial agents as the cause of diseases classified elsewhere: Secondary | ICD-10-CM

## 2012-09-15 MED ORDER — DOXYCYCLINE HYCLATE 100 MG PO CAPS: 100.0000 mg | ORAL_CAPSULE | Freq: Two times a day (BID) | ORAL | Status: DC

## 2012-09-15 NOTE — Patient Instructions (Signed)
This could be a staph infection but nothing to culture at this time. Local warm compresses  Cover   Add antibiotic .   If    persistent or progressive  Contact us for follow up.       MRSA Overview MRSA stands for methicillin-resistant Staphylococcus aureus. It is a type of bacteria that is resistant to some common antibiotics. It can cause infections in the skin and many other places in the body. Staphylococcus aureus, often called "staph," is a bacteria that normally lives on the skin or in the nose. Staph on the surface of the skin or in the nose does not cause problems. However, if the staph enters the body through a cut, wound, or break in the skin, an infection can happen. Up until recently, infections with the MRSA type of staph mainly occurred in hospitals and other healthcare settings. There are now increasing problems with MRSA infections in the community as well. Infections with MRSA may be very serious or even life-threatening. Most MRSA infections are acquired in one of two ways:  Healthcare-associated MRSA (HA-MRSA)  This can be acquired by people in any healthcare setting. MRSA can be a big problem for hospitalized people, people in nursing homes, people in rehabilitation facilities, people with weakened immune systems, dialysis patients, and those who have had surgery.  Community-associated MRSA (CA-MRSA)  Community spread of MRSA is becoming more common. It is known to spread in crowded settings, in jails and prisons, and in situations where there is close skin-to-skin contact, such as during sporting events or in locker rooms. MRSA can be spread through shared items, such as children's toys, razors, towels, or sports equipment. CAUSES  All staph, including MRSA, are normally harmless unless they enter the body through a scratch, cut, or wound, such as with surgery. All staph, including MRSA, can be spread from person-to-person by touching contaminated objects or through direct  contact. SPECIAL GROUPS MRSA can present problems for special groups of people. Some of these groups include:  Breastfeeding women.  The most common problem is MRSA infection of the breast (mastitis). There is evidence that MRSA can be passed to an infant from infected breast milk. Your caregiver may recommend that you stop breastfeeding until the mastitis is under control.  If you are breastfeeding and have a MRSA infection in a place other than the breast, you may usually continue breastfeeding while under treatment. If taking antibiotics, ask your caregiver if it is safe to continue breastfeeding while taking your prescribed medicines.  Neonates (babies from birth to 1 month old) and infants (babies from 70 month to 66 year old).  There is evidence that MRSA can be passed to a newborn at birth if the mother has MRSA on the skin, in or around the birth canal, or an infection in the uterus, cervix, or vagina. MRSA infection can have the same appearance as a normal newborn or infant rash or several other skin infections. This can make it hard to diagnose MRSA.  Immune compromised people.  If you have an immune system problem, you may have a higher chance of developing a MRSA infection.  People after any type of surgery.  Staph in general, including MRSA, is the most common cause of infections occurring at the site of recent surgery.  People on long-term steroid medicines.  These kinds of medicines can lower your resistance to infection. This can increase your chance of getting MRSA.  People who have had frequent hospitalizations, live in nursing  homes or other residential care facilities, have venous or urinary catheters, or have taken multiple courses of antibiotic therapy for any reason. DIAGNOSIS  Diagnosis of MRSA is done by cultures of fluid samples that may come from:  Swabs taken from cuts or wounds in infected areas.  Nasal swabs.  Saliva or deep cough specimens from the lungs  (sputum).  Urine.  Blood. Many people are "colonized" with MRSA but have no signs of infection. This means that people carry the MRSA germ on their skin or in their nose and may never develop MRSA infection.  TREATMENT  Treatment varies and is based on how serious, how deep, or how extensive the infection is. For example:  Some skin infections, such as a small boil or abscess, may be treated by draining yellowish-white fluid (pus) from the site of the infection.  Deeper or more widespread soft tissue infections are usually treated with surgery to drain pus and with antibiotic medicine given by vein or by mouth. This may be recommended even if you are pregnant.  Serious infections may require a hospital stay. If antibiotics are given, they may be needed for several weeks. PREVENTION  Because many people are colonized with staph, including MRSA, preventing the spread of the bacteria from person-to-person is most important. The best way to prevent the spread of bacteria and other germs is through proper hand washing or by using alcohol-based hand disinfectants. The following are other ways to help prevent MRSA infection within the hospital and community settings.   Healthcare settings:  Strict hand washing or hand disinfection procedures need to be followed before and after touching every patient.  Patients infected with MRSA are placed in isolation to prevent the spread of the bacteria.  Healthcare workers need to wear disposable gowns and gloves when touching or caring for patients infected with MRSA. Visitors may also be asked to wear a gown and gloves.  Hospital surfaces need to be disinfected frequently.  Community settings:  NIKE frequently with soap and water for at least 15 seconds. Otherwise, use alcohol-based hand disinfectants when soap and water is not available.  Make sure people who live with you wash their hands often, too.  Do not share personal items. For  example, avoid sharing razors and other personal hygiene items, towels, clothing, and athletic equipment.  Wash and dry your clothes and bedding at the warmest temperatures recommended on the labels.  Keep wounds covered. Pus from infected sores may contain MRSA and other bacteria. Keep cuts and abrasions clean and covered with germ-free (sterile), dry bandages until they are healed.  If you have a wound that appears infected, ask your caregiver if a culture for MRSA and other bacteria should be done.  If you are breastfeeding, talk to your caregiver about MRSA. You may be asked to temporarily stop breastfeeding. HOME CARE INSTRUCTIONS   Take your antibiotics as directed. Finish them even if you start to feel better.  Avoid close contact with those around you as much as possible. Do not use towels, razors, toothbrushes, bedding, or other items that will be used by others.  To fight the infection, follow your caregiver's instructions for wound care. Wash your hands before and after changing your bandages.  If you have an intravascular device, such as a catheter, make sure you know how to care for it.  Be sure to tell any healthcare providers that you have MRSA so they are aware of your infection. SEEK IMMEDIATE MEDICAL CARE IF:  The infection appears to be getting worse. Signs include:  Increased warmth, redness, or tenderness around the wound site.  A red line that extends from the infection site.  A dark color in the area around the infection.  Wound drainage that is tan, yellow, or green.  A bad smell coming from the wound.  You feel sick to your stomach (nauseous) and throw up (vomit) or cannot keep medicine down.  You have a fever.  Your baby is older than 3 months with a rectal temperature of 102 F (38.9 C) or higher.  Your baby is 31 months old or younger with a rectal temperature of 100.4 F (38 C) or higher.  You have difficulty breathing. MAKE SURE YOU:    Understand these instructions.  Will watch your condition.  Will get help right away if you are not doing well or get worse. Document Released: 03/18/2005 Document Revised: 06/10/2011 Document Reviewed: 06/20/2010 [facility] Patient Information 2014 [place], [place].  Community-Associated MRSA CA-MRSA stands for community-associated methicillin-resistant Staphylococcus aureus. MRSA is a type of bacteria that is resistant to some common antibiotics. It can cause infections in the skin and many other places in the body. Staphylococcus aureus, often called "staph," is a bacteria that normally lives on the skin or in the nose. Staph on the surface of the skin or in the nose does not cause problems. However, if the staph enters the body through a cut, wound, or break in the skin, an infection can happen. Up until recently, infections with the MRSA type of staph mainly occurred in hospitals and other healthcare settings. There are now increasing problems with MRSA infections in the community as well. Infections with MRSA may be very serious or even life-threatening. CA-MRSA is becoming more common. It is known to spread in crowded settings, in jails and prisons, and in situations where there is close skin-to-skin contact, such as during sporting events or in locker rooms. MRSA can be spread through shared items, such as children's toys, razors, towels, or sports equipment.  CAUSES All staph, including MRSA, are normally harmless unless they enter the body through a scratch, cut, or wound, such as with surgery. All staph, including MRSA, can be spread from person-to-person by touching contaminated objects or through direct contact.  MRSA now causes illness in people who have not been in hospitals or other healthcare facilities. Cases of MRSA diseases in the community have been associated with:  Recent antibiotic use.  Sharing contaminated towels or clothes.  Having active skin  diseases.  Participating in contact sports.  Living in crowded settings.  Intravenous (IV) drug use.  Community-associated MRSA infections are usually skin infections, but may cause other severe illnesses.  Staph bacteria are one of the most common causes of skin infection. However, they are also a common cause of pneumonia, bone or joint infections, and bloodstream infections. DIAGNOSIS Diagnosis of MRSA is done by cultures of fluid samples that may come from:  Swabs taken from cuts or wounds in infected areas.  Nasal swabs.  Saliva or deep cough specimens from the lungs (sputum).  Urine.  Blood. Many people are "colonized" with MRSA but have no signs of infection. This means that people carry the MRSA germ on their skin or in their nose and may never develop MRSA infection.  TREATMENT  Treatment varies and is based on how serious, how deep, or how extensive the infection is. For example:  Some skin infections, such as a small boil or abscess, may be  treated by draining yellowish-white fluid (pus) from the site of the infection.  Deeper or more widespread soft tissue infections are usually treated with surgery to drain pus and with antibiotic medicine given by vein or by mouth. This may be recommended even if you are pregnant.  Serious infections may require a hospital stay. If antibiotics are given, they may be needed for several weeks. PREVENTION Because many people are colonized with staph, including MRSA, preventing the spread of the bacteria from person-to-person is most important. The best way to prevent the spread of bacteria and other germs is through proper hand washing or by using alcohol-based hand disinfectants. The following are other ways to help prevent MRSA infection within community settings.   Wash your hands frequently with soap and water for at least 15 seconds. Otherwise, use alcohol-based hand disinfectants when soap and water is not available.  Make sure  people who live with you wash their hands often, too.  Do not share personal items. For example, avoid sharing razors and other personal hygiene items, towels, clothing, and athletic equipment.  Wash and dry your clothes and bedding at the warmest temperatures recommended on the labels.  Keep wounds covered. Pus from infected sores may contain MRSA and other bacteria. Keep cuts and abrasions clean and covered with germ-free (sterile), dry bandages until they are healed.  If you have a wound that appears infected, ask your caregiver if a culture for MRSA and other bacteria should be done.  If you are breastfeeding, talk to your caregiver about MRSA. You may be asked to temporarily stop breastfeeding. HOME CARE INSTRUCTIONS   Take your antibiotics as directed. Finish them even if you start to feel better.  Avoid close contact with those around you as much as possible. Do not use towels, razors, toothbrushes, bedding, or other items that will be used by others.  To fight the infection, follow your caregiver's instructions for wound care. Wash your hands before and after changing your bandages.  If you have an intravascular device, such as a catheter, make sure you know how to care for it.  Be sure to tell any healthcare providers that you have MRSA so they are aware of your infection. SEEK IMMEDIATE MEDICAL CARE IF:  The infection appears to be getting worse. Signs include:  Increased warmth, redness, or tenderness around the wound site.  A red line that extends from the infection site.  A dark color in the area around the infection.  Wound drainage that is tan, yellow, or green.  A bad smell coming from the wound.  You feel sick to your stomach (nauseous) and throw up (vomit) or cannot keep medicine down.  You have a fever.  Your baby is older than 3 months with a rectal temperature of 102 F (38.9 C) or higher.  Your baby is 16 months old or younger with a rectal temperature  of 100.4 F (38 C) or higher.  You have difficulty breathing. MAKE SURE YOU:   Understand these instructions.  Will watch your condition.  Will get help right away if you are not doing well or get worse. Document Released: 06/21/2005 Document Revised: 06/10/2011 Document Reviewed: 06/21/2010 [facility] Patient Information 2014 [place], [place].

## 2012-09-15 NOTE — Progress Notes (Signed)
Chief Complaint  Patient presents with  . Wound    Discovered last week.  Daughter has the same and was said to have mersa in it.    HPI: Patient comes in today for SDA for  new problem evaluation. onsdet a few day sago of a blister like spoot left arm that had oincreased redness and stinging soreness   soince then  Nothing to drain   No rx otherwise . Daughter had large rash abscess and drained and grew MRSA present  This week end On septra  meds  No hx of recurrent skin infections   ROS: See pertinent positives and negatives per HPI. No fever or chills no history of staph infections  Past Medical History  Diagnosis Date  . Migraine   . Physiological tremor     exagerated has used b blocker in past  . Blood glucose elevated     borderline    Family History  Problem Relation Age of Onset  . Hypertension    . Gallbladder disease Mother   . Colon cancer    . Lung cancer    . Coronary artery disease Father     in his 43's cabg x 5    History   Social History  . Marital Status: Married    Spouse Name: N/A    Number of Children: N/A  . Years of Education: N/A   Social History Main Topics  . Smoking status: Never Smoker   . Smokeless tobacco: None  . Alcohol Use: No  . Drug Use: None  . Sexually Active: None   Other Topics Concern  . 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     Has a farm and blended family   2 dog and GP   Drinks milk and lots of caffeine   Not regular exercise   Sleep 5 hours    Outpatient Encounter Prescriptions as of 09/15/2012  Medication Sig Dispense Refill  . doxycycline (VIBRAMYCIN) 100 MG capsule Take 1 capsule (100 mg total) by mouth 2 (two) times daily.  20 capsule  0  . [DISCONTINUED] SUMAtriptan (IMITREX) 100 MG tablet Take 1 tablet (100 mg total) by mouth every 2 (two) hours as needed for migraine. Max 2 per day  9 tablet  2    No facility-administered encounter medications on file as of 09/15/2012.    EXAM:  BP 142/90  Pulse 86  Temp(Src) 97.9 F (36.6 C) (Oral)  Wt 156 lb (70.761 kg)  BMI 28.31 kg/m2  SpO2 97%  LMP 09/13/2012  Body mass index is 28.31 kg/(m^2).  GENERAL: vitals reviewed and listed above, alert, oriented, appears well hydrated and in no acute distress  HEENT: atraumatic, conjunctiva  clear, no obvious abnormalities on inspection of external nose and ears  NECK: no obvious masses on inspection palpation  Left arm lateral upper  Central scab  With 1.5 cm induration and no fluctuance and surrounding  fainte erythema without streaking   About 5-6 cm  .midly tender no vesicle  PSYCH: pleasant and cooperative, no obvious depression or anxiety  ASSESSMENT AND PLAN:  Discussed the following assessment and plan:  Localized bacterial skin infection - presumed mrsa cause of daughters pos tests looks ;like a bite almost  -Patient advised to return or notify health care team  if symptoms worsen or persist or  new concerns arise.  Patient Instructions  This could be a staph infection but nothing to culture at this time. Local warm compresses  Cover   Add antibiotic .   If    persistent or progressive  Contact us for follow up.       MRSA Overview MRSA stands for methicillin-resistant Staphylococcus aureus. It is a type of bacteria that is resistant to some common antibiotics. It can cause infections in the skin and many other places in the body. Staphylococcus aureus, often called "staph," is a bacteria that normally lives on the skin or in the nose. Staph on the surface of the skin or in the nose does not cause problems. However, if the staph enters the body through a cut, wound, or break in the skin, an infection can happen. Up until recently, infections with the MRSA type of staph mainly occurred in hospitals and other healthcare settings. There are now increasing problems with MRSA  infections in the community as well. Infections with MRSA may be very serious or even life-threatening. Most MRSA infections are acquired in one of two ways:  Healthcare-associated MRSA (HA-MRSA)  This can be acquired by people in any healthcare setting. MRSA can be a big problem for hospitalized people, people in nursing homes, people in rehabilitation facilities, people with weakened immune systems, dialysis patients, and those who have had surgery.  Community-associated MRSA (CA-MRSA)  Community spread of MRSA is becoming more common. It is known to spread in crowded settings, in jails and prisons, and in situations where there is close skin-to-skin contact, such as during sporting events or in locker rooms. MRSA can be spread through shared items, such as children's toys, razors, towels, or sports equipment. CAUSES  All staph, including MRSA, are normally harmless unless they enter the body through a scratch, cut, or wound, such as with surgery. All staph, including MRSA, can be spread from person-to-person by touching contaminated objects or through direct contact. SPECIAL GROUPS MRSA can present problems for special groups of people. Some of these groups include:  Breastfeeding women.  The most common problem is MRSA infection of the breast (mastitis). There is evidence that MRSA can be passed to an infant from infected breast milk. Your caregiver may recommend that you stop breastfeeding until the mastitis is under control.  If you are breastfeeding and have a MRSA infection in a place other than the breast, you may usually continue breastfeeding while under treatment. If taking antibiotics, ask your caregiver if it is safe to continue breastfeeding while taking your prescribed medicines.  Neonates (babies from birth to 41 month old) and infants (babies from 68 month to 64 year old).  There is evidence that MRSA can be passed to a newborn at birth if the mother has MRSA on the skin, in or  around the birth canal, or an infection in the uterus, cervix, or vagina. MRSA infection can have the same appearance as a normal newborn or infant rash or several other skin infections. This can make it hard to diagnose MRSA.  Immune compromised people.  If you have an immune system problem, you may have a higher chance of developing a MRSA infection.  People after any type of surgery.  Staph in general, including MRSA, is the most common cause of infections occurring at the site of recent surgery.  People on long-term steroid medicines.  These kinds of medicines can lower your resistance to infection. This can increase your chance of getting MRSA.  People  who have had frequent hospitalizations, live in nursing homes or other residential care facilities, have venous or urinary catheters, or have taken multiple courses of antibiotic therapy for any reason. DIAGNOSIS  Diagnosis of MRSA is done by cultures of fluid samples that may come from:  Swabs taken from cuts or wounds in infected areas.  Nasal swabs.  Saliva or deep cough specimens from the lungs (sputum).  Urine.  Blood. Many people are "colonized" with MRSA but have no signs of infection. This means that people carry the MRSA germ on their skin or in their nose and may never develop MRSA infection.  TREATMENT  Treatment varies and is based on how serious, how deep, or how extensive the infection is. For example:  Some skin infections, such as a small boil or abscess, may be treated by draining yellowish-white fluid (pus) from the site of the infection.  Deeper or more widespread soft tissue infections are usually treated with surgery to drain pus and with antibiotic medicine given by vein or by mouth. This may be recommended even if you are pregnant.  Serious infections may require a hospital stay. If antibiotics are given, they may be needed for several weeks. PREVENTION  Because many people are colonized with staph,  including MRSA, preventing the spread of the bacteria from person-to-person is most important. The best way to prevent the spread of bacteria and other germs is through proper hand washing or by using alcohol-based hand disinfectants. The following are other ways to help prevent MRSA infection within the hospital and community settings.   Healthcare settings:  Strict hand washing or hand disinfection procedures need to be followed before and after touching every patient.  Patients infected with MRSA are placed in isolation to prevent the spread of the bacteria.  Healthcare workers need to wear disposable gowns and gloves when touching or caring for patients infected with MRSA. Visitors may also be asked to wear a gown and gloves.  Hospital surfaces need to be disinfected frequently.  Community settings:  NIKE frequently with soap and water for at least 15 seconds. Otherwise, use alcohol-based hand disinfectants when soap and water is not available.  Make sure people who live with you wash their hands often, too.  Do not share personal items. For example, avoid sharing razors and other personal hygiene items, towels, clothing, and athletic equipment.  Wash and dry your clothes and bedding at the warmest temperatures recommended on the labels.  Keep wounds covered. Pus from infected sores may contain MRSA and other bacteria. Keep cuts and abrasions clean and covered with germ-free (sterile), dry bandages until they are healed.  If you have a wound that appears infected, ask your caregiver if a culture for MRSA and other bacteria should be done.  If you are breastfeeding, talk to your caregiver about MRSA. You may be asked to temporarily stop breastfeeding. HOME CARE INSTRUCTIONS   Take your antibiotics as directed. Finish them even if you start to feel better.  Avoid close contact with those around you as much as possible. Do not use towels, razors, toothbrushes, bedding, or  other items that will be used by others.  To fight the infection, follow your caregiver's instructions for wound care. Wash your hands before and after changing your bandages.  If you have an intravascular device, such as a catheter, make sure you know how to care for it.  Be sure to tell any healthcare providers that you have MRSA so they are aware  of your infection. SEEK IMMEDIATE MEDICAL CARE IF:   The infection appears to be getting worse. Signs include:  Increased warmth, redness, or tenderness around the wound site.  A red line that extends from the infection site.  A dark color in the area around the infection.  Wound drainage that is tan, yellow, or green.  A bad smell coming from the wound.  You feel sick to your stomach (nauseous) and throw up (vomit) or cannot keep medicine down.  You have a fever.  Your baby is older than 3 months with a rectal temperature of 102 F (38.9 C) or higher.  Your baby is 36 months old or younger with a rectal temperature of 100.4 F (38 C) or higher.  You have difficulty breathing. MAKE SURE YOU:   Understand these instructions.  Will watch your condition.  Will get help right away if you are not doing well or get worse. Document Released: 03/18/2005 Document Revised: 06/10/2011 Document Reviewed: 06/20/2010 [facility] Patient Information 2014 [place], [place].  Community-Associated MRSA CA-MRSA stands for community-associated methicillin-resistant Staphylococcus aureus. MRSA is a type of bacteria that is resistant to some common antibiotics. It can cause infections in the skin and many other places in the body. Staphylococcus aureus, often called "staph," is a bacteria that normally lives on the skin or in the nose. Staph on the surface of the skin or in the nose does not cause problems. However, if the staph enters the body through a cut, wound, or break in the skin, an infection can happen. Up until recently, infections with the MRSA  type of staph mainly occurred in hospitals and other healthcare settings. There are now increasing problems with MRSA infections in the community as well. Infections with MRSA may be very serious or even life-threatening. CA-MRSA is becoming more common. It is known to spread in crowded settings, in jails and prisons, and in situations where there is close skin-to-skin contact, such as during sporting events or in locker rooms. MRSA can be spread through shared items, such as children's toys, razors, towels, or sports equipment.  CAUSES All staph, including MRSA, are normally harmless unless they enter the body through a scratch, cut, or wound, such as with surgery. All staph, including MRSA, can be spread from person-to-person by touching contaminated objects or through direct contact.  MRSA now causes illness in people who have not been in hospitals or other healthcare facilities. Cases of MRSA diseases in the community have been associated with:  Recent antibiotic use.  Sharing contaminated towels or clothes.  Having active skin diseases.  Participating in contact sports.  Living in crowded settings.  Intravenous (IV) drug use.  Community-associated MRSA infections are usually skin infections, but may cause other severe illnesses.  Staph bacteria are one of the most common causes of skin infection. However, they are also a common cause of pneumonia, bone or joint infections, and bloodstream infections. DIAGNOSIS Diagnosis of MRSA is done by cultures of fluid samples that may come from:  Swabs taken from cuts or wounds in infected areas.  Nasal swabs.  Saliva or deep cough specimens from the lungs (sputum).  Urine.  Blood. Many people are "colonized" with MRSA but have no signs of infection. This means that people carry the MRSA germ on their skin or in their nose and may never develop MRSA infection.  TREATMENT  Treatment varies and is based on how serious, how deep, or how  extensive the infection is. For example:  Some skin  infections, such as a small boil or abscess, may be treated by draining yellowish-white fluid (pus) from the site of the infection.  Deeper or more widespread soft tissue infections are usually treated with surgery to drain pus and with antibiotic medicine given by vein or by mouth. This may be recommended even if you are pregnant.  Serious infections may require a hospital stay. If antibiotics are given, they may be needed for several weeks. PREVENTION Because many people are colonized with staph, including MRSA, preventing the spread of the bacteria from person-to-person is most important. The best way to prevent the spread of bacteria and other germs is through proper hand washing or by using alcohol-based hand disinfectants. The following are other ways to help prevent MRSA infection within community settings.   Wash your hands frequently with soap and water for at least 15 seconds. Otherwise, use alcohol-based hand disinfectants when soap and water is not available.  Make sure people who live with you wash their hands often, too.  Do not share personal items. For example, avoid sharing razors and other personal hygiene items, towels, clothing, and athletic equipment.  Wash and dry your clothes and bedding at the warmest temperatures recommended on the labels.  Keep wounds covered. Pus from infected sores may contain MRSA and other bacteria. Keep cuts and abrasions clean and covered with germ-free (sterile), dry bandages until they are healed.  If you have a wound that appears infected, ask your caregiver if a culture for MRSA and other bacteria should be done.  If you are breastfeeding, talk to your caregiver about MRSA. You may be asked to temporarily stop breastfeeding. HOME CARE INSTRUCTIONS   Take your antibiotics as directed. Finish them even if you start to feel better.  Avoid close contact with those around you as much as  possible. Do not use towels, razors, toothbrushes, bedding, or other items that will be used by others.  To fight the infection, follow your caregiver's instructions for wound care. Wash your hands before and after changing your bandages.  If you have an intravascular device, such as a catheter, make sure you know how to care for it.  Be sure to tell any healthcare providers that you have MRSA so they are aware of your infection. SEEK IMMEDIATE MEDICAL CARE IF:  The infection appears to be getting worse. Signs include:  Increased warmth, redness, or tenderness around the wound site.  A red line that extends from the infection site.  A dark color in the area around the infection.  Wound drainage that is tan, yellow, or green.  A bad smell coming from the wound.  You feel sick to your stomach (nauseous) and throw up (vomit) or cannot keep medicine down.  You have a fever.  Your baby is older than 3 months with a rectal temperature of 102 F (38.9 C) or higher.  Your baby is 99 months old or younger with a rectal temperature of 100.4 F (38 C) or higher.  You have difficulty breathing. MAKE SURE YOU:   Understand these instructions.  Will watch your condition.  Will get help right away if you are not doing well or get worse. Document Released: 06/21/2005 Document Revised: 06/10/2011 Document Reviewed: 06/21/2010 [facility] Patient Information 2014 [place], [place].      Neta Mends. Panosh M.D.

## 2014-01-14 ENCOUNTER — Other Ambulatory Visit: Payer: Self-pay

## 2014-01-24 ENCOUNTER — Other Ambulatory Visit (INDEPENDENT_AMBULATORY_CARE_PROVIDER_SITE_OTHER): Payer: Commercial Managed Care - PPO

## 2014-01-24 DIAGNOSIS — Z0000 Encounter for general adult medical examination without abnormal findings: Secondary | ICD-10-CM

## 2014-01-24 LAB — CBC WITH DIFFERENTIAL/PLATELET
Basophils Absolute: 0 10*3/uL (ref 0.0–0.1)
Basophils Relative: 0.4 % (ref 0.0–3.0)
EOS ABS: 0.2 10*3/uL (ref 0.0–0.7)
EOS PCT: 2.7 % (ref 0.0–5.0)
HCT: 40.8 % (ref 36.0–46.0)
Hemoglobin: 13.4 g/dL (ref 12.0–15.0)
LYMPHS PCT: 19.4 % (ref 12.0–46.0)
Lymphs Abs: 1.6 10*3/uL (ref 0.7–4.0)
MCHC: 32.9 g/dL (ref 30.0–36.0)
MCV: 86.8 fl (ref 78.0–100.0)
MONOS PCT: 5.7 % (ref 3.0–12.0)
Monocytes Absolute: 0.5 10*3/uL (ref 0.1–1.0)
NEUTROS ABS: 6 10*3/uL (ref 1.4–7.7)
NEUTROS PCT: 71.8 % (ref 43.0–77.0)
PLATELETS: 284 10*3/uL (ref 150.0–400.0)
RBC: 4.7 Mil/uL (ref 3.87–5.11)
RDW: 13.9 % (ref 11.5–15.5)
WBC: 8.4 10*3/uL (ref 4.0–10.5)

## 2014-01-24 LAB — HEPATIC FUNCTION PANEL
ALBUMIN: 3.3 g/dL — AB (ref 3.5–5.2)
ALT: 14 U/L (ref 0–35)
AST: 18 U/L (ref 0–37)
Alkaline Phosphatase: 106 U/L (ref 39–117)
BILIRUBIN TOTAL: 0.1 mg/dL — AB (ref 0.2–1.2)
Bilirubin, Direct: 0 mg/dL (ref 0.0–0.3)
Total Protein: 7.5 g/dL (ref 6.0–8.3)

## 2014-01-24 LAB — BASIC METABOLIC PANEL
BUN: 11 mg/dL (ref 6–23)
CO2: 28 mEq/L (ref 19–32)
Calcium: 9.3 mg/dL (ref 8.4–10.5)
Chloride: 107 mEq/L (ref 96–112)
Creatinine, Ser: 0.9 mg/dL (ref 0.4–1.2)
GFR: 73.32 mL/min (ref >60.00)
GLUCOSE: 104 mg/dL — AB (ref 70–99)
POTASSIUM: 4.4 meq/L (ref 3.5–5.1)
SODIUM: 139 meq/L (ref 135–145)

## 2014-01-24 LAB — LIPID PANEL
CHOL/HDL RATIO: 3
Cholesterol: 172 mg/dL (ref 0–200)
HDL: 56.8 mg/dL (ref >39.00)
LDL CALC: 100 mg/dL — AB (ref 0–99)
NONHDL: 115.2
Triglycerides: 78 mg/dL (ref 0.0–149.0)
VLDL: 15.6 mg/dL (ref 0.0–40.0)

## 2014-01-24 LAB — TSH: TSH: 1.69 u[IU]/mL (ref 0.35–4.50)

## 2014-01-31 ENCOUNTER — Encounter: Payer: Self-pay | Admitting: Internal Medicine

## 2014-01-31 ENCOUNTER — Ambulatory Visit (INDEPENDENT_AMBULATORY_CARE_PROVIDER_SITE_OTHER): Payer: Commercial Managed Care - PPO | Admitting: Internal Medicine

## 2014-01-31 VITALS — BP 150/80 | Temp 98.7°F | Ht 62.5 in | Wt 147.0 lb

## 2014-01-31 DIAGNOSIS — Z23 Encounter for immunization: Secondary | ICD-10-CM

## 2014-01-31 DIAGNOSIS — R030 Elevated blood-pressure reading, without diagnosis of hypertension: Secondary | ICD-10-CM

## 2014-01-31 DIAGNOSIS — Z0000 Encounter for general adult medical examination without abnormal findings: Secondary | ICD-10-CM

## 2014-01-31 NOTE — Patient Instructions (Addendum)
Continue lifestyle intervention healthy eating and exercise . Healthy lifestyle includes : At least 150 minutes of exercise weeks  , weight at healthy levels, which is usually   BMI 19-25. Avoid trans fats and processed foods;  Increase fresh fruits and veges to 5 servings per day. And avoid sweet beverages including tea and juice. Mediterranean diet with olive oil and nuts have been noted to be heart and brain healthy . Avoid tobacco products . Limit  alcohol to  7 per week for women and 14 servings for men.  Get adequate sleep . Wear seat belts . Don't text and drive .  Watch the sugars and sweets and simple carbohydrates  Breads and baked goods.   Take blood pressure readings twice a day for 7- 10 days and then periodically .To ensure below 140/90   .Send in readings     MAM[employer]gram by age 42   How to Take Your Blood Pressure HOW DO I GET A BLOOD PRESSURE MACHINE?  You can buy an electronic home blood pressure machine at your local pharmacy. Insurance will sometimes cover the cost if you have a prescription.  Ask your doctor what type of machine is best for you. There are different machines for your arm and your wrist.  If you decide to buy a machine to check your blood pressure on your arm, first check the size of your arm so you can buy the right size cuff. To check the size of your arm:   Use a measuring tape that shows both inches and centimeters.   Wrap the measuring tape around the upper-middle part of your arm. You may need someone to help you measure.   Write down your arm measurement in both inches and centimeters.   To measure your blood pressure correctly, it is important to have the right size cuff.   If your arm is up to 13 inches (up to 34 centimeters), get an adult cuff size.  If your arm is 13 to 17 inches (35 to 44 centimeters), get a large adult cuff size.    If your arm is 17 to 20 inches (45 to 52 centimeters), get an adult thigh cuff.  WHAT DO THE  NUMBERS MEAN?   There are two numbers that make up your blood pressure. For example: 120/80.  The first number (120 in our example) is called the "systolic pressure." It is a measure of the pressure in your blood vessels when your heart is pumping blood.  The second number (80 in our example) is called the "diastolic pressure." It is a measure of the pressure in your blood vessels when your heart is resting between beats.  Your doctor will tell you what your blood pressure should be. WHAT SHOULD I DO BEFORE I CHECK MY BLOOD PRESSURE?   Try to rest or relax for at least 30 minutes before you check your blood pressure.  Do not smoke.  Do not have any drinks with caffeine, such as:  Soda.  Coffee.  Tea.  Check your blood pressure in a quiet room.  Sit down and stretch out your arm on a table. Keep your arm at about the level of your heart. Let your arm relax.  Make sure that your legs are not crossed. HOW DO I CHECK MY BLOOD PRESSURE?  Follow the directions that came with your machine.  Make sure you remove any tight-fighting clothing from your arm or wrist. Wrap the cuff around your upper arm or wrist. You  should be able to fit a finger between the cuff and your arm. If you cannot fit a finger between the cuff and your arm, it is too tight and should be removed and rewrapped.  Some units require you to manually pump up the arm cuff.  Automatic units inflate the cuff when you press a button.  Cuff deflation is automatic in both models.  After the cuff is inflated, the unit measures your blood pressure and pulse. The readings are shown on a monitor. Hold still and breathe normally while the cuff is inflated.  Getting a reading takes less than a minute.  Some models store readings in a memory. Some provide a printout of readings. If your machine does not store your readings, keep a written record.  Take readings with you to your next visit with your doctor. Document  Released: 02/29/2008 Document Revised: 08/02/2013 Document Reviewed: 05/13/2013 [facility] Patient Information 2015 [place], [place]. This information is not intended to replace advice given to you by your health care provider. Make sure you discuss any questions you have with your health care provider.

## 2014-01-31 NOTE — Progress Notes (Signed)
Pre visit review using our clinic review tool, if applicable. No additional management support is needed unless otherwise documented below in the visit note.  Chief Complaint  Patient presents with  . Annual Exam    HPI: Patient comes in today for Preventive Health Care visit  No major health changes  Now has to commute from [place] lots of time in car and sometimes doesnt eat all day ( busy ) HAs  Related to periods   .   Betsy Coder now age 43 years   Health Maintenance  Topic Date Due  . Samul Dada  08/30/2013  . PAP SMEAR  09/06/2014  . INFLUENZA VACCINE  10/31/2014   Health Maintenance Review LIFESTYLE:  Exercise:  Not as much as in past.  Tobacco/ETS: no   Alcohol: no Sugar beverages: water ocass sweet tea on weekends .  Sleep: hours about 5  Drug use: no PAP:utd Mammo: a while back no issues   ROS:  GEN/ HEENT: No fever, significant weight changes sweats headaches vision problems hearing changes, CV/ PULM; No chest pain shortness of breath cough, syncope,edema  change in exercise tolerance. GI /GU: No adominal pain, vomiting, change in bowel habits. No blood in the stool. No significant GU symptoms. SKIN/HEME: ,no acute skin rashes suspicious lesions or bleeding. No lymphadenopathy, nodules, masses.  NEURO/ PSYCH:  No neurologic signs such as weakness numbness. No depression anxiety. IMM/ Allergy: No unusual infections.  Allergy .   REST of 12 system review negative except as per HPI   Past Medical History  Diagnosis Date  . Migraine   . Physiological tremor     exagerated has used b blocker in past  . Blood glucose elevated     borderline    Family History  Problem Relation Age of Onset  . Hypertension    . Gallbladder disease Mother   . Colon cancer    . Lung cancer    . Coronary artery disease Father     in his 77's cabg x 5    History   Social History  . Marital Status: Married    Spouse Name: N/A    Number of Children: N/A  . Years of  Education: N/A   Social History Main Topics  . Smoking status: Never Smoker   . Smokeless tobacco: None  . Alcohol Use: No  . Drug Use: None  . Sexual Activity: None   Other Topics Concern  . 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    [employer]   Commuting from [place].    Has a farm and blended family   2 dog and GP   Drinks milk and lots of caffeine   Not regular exercise   Sleep 5 hours    Outpatient Encounter Prescriptions as of 01/31/2014  Medication Sig  . [DISCONTINUED] doxycycline (VIBRAMYCIN) 100 MG capsule Take 1 capsule (100 mg total) by mouth 2 (two) times daily.    EXAM:  BP 150/80 mmHg  Temp(Src) 98.7 F (37.1 C) (Oral)  Ht 5' 2.5" (1.588 m)  Wt 147 lb (66.679 kg)  BMI 26.44 kg/m2  Body mass index is 26.44 kg/(m^2).  Physical Exam: Vital signs reviewed [id number] is a well-developed well-nourished alert cooperative    who appearsr stated age in no acute distress.  HEENT: normocephalic atraumatic , Eyes: PERRL EOM's  full, conjunctiva clear, Nares: paten,t no deformity discharge or tenderness., Ears: no deformity EAC's clear TMs with normal landmarks. Mouth: clear OP, no lesions, edema.  Moist mucous membranes. Dentition in adequate repair. NECK: supple without masses, thyromegaly or bruits. CHEST/PULM:  Clear to auscultation and percussion breath sounds equal no wheeze , rales or rhonchi. No chest wall deformities or tenderness. CV: PMI is nondisplaced, S1 S2 no gallops, murmurs, rubs. Peripheral pulses are full without delay.No JVD .  Breast: normal by inspection . No dimpling, discharge, masses, tenderness or discharge . ABDOMEN: Bowel sounds normal nontender  No guard or rebound, no hepato splenomegal no CVA tenderness.  No hernia. Extremtities:  No clubbing cyanosis or edema, no acute joint swelling or redness no focal atrophy some VV near  ankles no ulceration or redness NEURO:  Oriented x3, cranial nerves 3-12 appear to be intact, no obvious focal weakness,gait within normal limits no abnormal reflexes or asymmetrical SKIN: No acute rashes normal turgor, color, no bruising or petechiae. PSYCH: Oriented, good eye contact, no obvious depression anxiety, cognition and judgment appear normal. LN: no cervical axillary inguinal adenopathy  Lab Results  Component Value Date   WBC 8.4 01/24/2014   HGB 13.4 01/24/2014   HCT 40.8 01/24/2014   PLT 284.0 01/24/2014   GLUCOSE 104* 01/24/2014   CHOL 172 01/24/2014   TRIG 78.0 01/24/2014   HDL 56.80 01/24/2014   LDLCALC 100* 01/24/2014   ALT 14 01/24/2014   AST 18 01/24/2014   NA 139 01/24/2014   K 4.4 01/24/2014   CL 107 01/24/2014   CREATININE 0.9 01/24/2014   BUN 11 01/24/2014   CO2 28 01/24/2014   TSH 1.69 01/24/2014    ASSESSMENT AND PLAN:  Discussed the following assessment and plan:  Visit for preventive health examination  Need for Tdap vaccination - Plan: Tdap vaccine greater than or equal to 7yo IM  Elevated blood pressure reading - fu if persistently elevated dash diet healthy eating exrercise counseling. Borderline glucose Counseled regarding healthy nutrition, exercise, sleep, injury prevention, calcium vit d and healthy weight . Check bp readings and send in  Plan rx if needed  Patient Care Team: Burnis Medin, MD as PCP - General Cyril Mourning, MD as Attending Physician (Obstetrics and Gynecology) Patient Instructions  Continue lifestyle intervention healthy eating and exercise . Healthy lifestyle includes : At least 150 minutes of exercise weeks  , weight at healthy levels, which is usually   BMI 19-25. Avoid trans fats and processed foods;  Increase fresh fruits and veges to 5 servings per day. And avoid sweet beverages including tea and juice. Mediterranean diet with olive oil and nuts have been noted to be heart and brain healthy . Avoid  tobacco products . Limit  alcohol to  7 per week for women and 14 servings for men.  Get adequate sleep . Wear seat belts . Don't text and drive .  Watch the sugars and sweets and simple carbohydrates  Breads and baked goods.   Take blood pressure readings twice a day for 7- 10 days and then periodically .To ensure below 140/90   .Send in readings     MAMMOgram by age 59   How to Take Your Blood Pressure HOW DO I GET A BLOOD PRESSURE MACHINE?  You can buy an electronic home blood pressure machine at your local pharmacy. Insurance will sometimes cover the cost if you have a prescription.  Ask your doctor what type of machine is best for  you. There are different machines for your arm and your wrist.  If you decide to buy a machine to check your blood pressure on your arm, first check the size of your arm so you can buy the right size cuff. To check the size of your arm:   Use a measuring tape that shows both inches and centimeters.   Wrap the measuring tape around the upper-middle part of your arm. You may need someone to help you measure.   Write down your arm measurement in both inches and centimeters.   To measure your blood pressure correctly, it is important to have the right size cuff.   If your arm is up to 13 inches (up to 34 centimeters), get an adult cuff size.  If your arm is 13 to 17 inches (35 to 44 centimeters), get a large adult cuff size.    If your arm is 17 to 20 inches (45 to 52 centimeters), get an adult thigh cuff.  WHAT DO THE NUMBERS MEAN?   There are two numbers that make up your blood pressure. For example: 120/80.  The first number (120 in our example) is called the "systolic pressure." It is a measure of the pressure in your blood vessels when your heart is pumping blood.  The second number (80 in our example) is called the "diastolic pressure." It is a measure of the pressure in your blood vessels when your heart is resting between beats.  Your  doctor will tell you what your blood pressure should be. WHAT SHOULD I DO BEFORE I CHECK MY BLOOD PRESSURE?   Try to rest or relax for at least 30 minutes before you check your blood pressure.  Do not smoke.  Do not have any drinks with caffeine, such as:  Soda.  Coffee.  Tea.  Check your blood pressure in a quiet room.  Sit down and stretch out your arm on a table. Keep your arm at about the level of your heart. Let your arm relax.  Make sure that your legs are not crossed. HOW DO I CHECK MY BLOOD PRESSURE?  Follow the directions that came with your machine.  Make sure you remove any tight-fighting clothing from your arm or wrist. Wrap the cuff around your upper arm or wrist. You should be able to fit a finger between the cuff and your arm. If you cannot fit a finger between the cuff and your arm, it is too tight and should be removed and rewrapped.  Some units require you to manually pump up the arm cuff.  Automatic units inflate the cuff when you press a button.  Cuff deflation is automatic in both models.  After the cuff is inflated, the unit measures your blood pressure and pulse. The readings are shown on a monitor. Hold still and breathe normally while the cuff is inflated.  Getting a reading takes less than a minute.  Some models store readings in a memory. Some provide a printout of readings. If your machine does not store your readings, keep a written record.  Take readings with you to your next visit with your doctor. Document Released: 02/29/2008 Document Revised: 08/02/2013 Document Reviewed: 05/13/2013 [facility] Patient Information 2015 [place], [place]. This information is not intended to replace advice given to you by your health care provider. Make sure you discuss any questions you have with your health care provider.        Standley Brooking. Panosh M.D.

## 2014-07-22 ENCOUNTER — Telehealth: Payer: Self-pay | Admitting: Internal Medicine

## 2014-07-22 NOTE — Telephone Encounter (Signed)
[facility] [place] Day - Client [facility] Patient Name: Lauren Owens DOB: 08/04/1970 Initial Comment Caller states scheduled period was 2 weeks ago, and started bleeding again today, wants to know if it is possible to have 2 periods in 1 month Nurse Assessment Nurse: Erlene Quan, RN, Manuela Schwartz Date/Time Eilene Ghazi Time): 07/22/2014 12:24:37 PM Confirm and document reason for call. If symptomatic, describe symptoms. ---Caller states scheduled period was 2 weeks ago, and started bleeding again today, wants to know if it is possible to have 2 periods in 1 month or less then her normal 24 - 25 days apart - states today its like a regular period but she is not having the breast tenderness she normally does Has the patient traveled out of the country within the last 30 days? ---Not Applicable Does the patient require triage? ---Yes Related visit to physician within the last 2 weeks? ---No Does the PT have any chronic conditions? (i.e. diabetes, asthma, etc.) ---No Did the patient indicate they were pregnant? ---No Guidelines Guideline Title Affirmed Question Affirmed Notes Vaginal Bleeding - Abnormal [1] Menstrual cycle < 21 days OR > 35 days AND [2] has occurred once this past year (all triage questions negative) Final Disposition User [place], RN, Manuela Schwartz

## 2014-07-22 NOTE — Telephone Encounter (Signed)
Called and spoke with patient. Patient stated she felt good about instructions given by [facility] and is aware of symptoms related to blood loss (light-headedness, fatigue, racing heart). Patient states she has an appointment to see OBGYN on May 2nd.

## 2014-07-22 NOTE — Telephone Encounter (Signed)
Below addendum not included on original message    HOME CARE: You should be able to treat this at home. REASSURANCE: If you continue to have menstrual cycles that are unusually short (under 21 days) or unusually long (over 35 days), then you should see a healthcare provider for an examination. CALL BACK IF: * Bleeding becomes heavy * Bleeding lasts over 7 days * You continue to have menstrual cycles that are unusually short (under 21 days) or unusually long (over 35 days) * You become worse. CARE ADVICE given per Vaginal Bleeding, Abnormal (Adult) guideline.

## 2014-07-29 ENCOUNTER — Encounter: Payer: Self-pay | Admitting: Internal Medicine

## 2014-08-01 ENCOUNTER — Other Ambulatory Visit: Payer: Self-pay | Admitting: Obstetrics and Gynecology

## 2014-08-02 LAB — CYTOLOGY - PAP

## 2016-04-16 ENCOUNTER — Other Ambulatory Visit (INDEPENDENT_AMBULATORY_CARE_PROVIDER_SITE_OTHER): Payer: Commercial Managed Care - PPO

## 2016-04-16 DIAGNOSIS — Z0000 Encounter for general adult medical examination without abnormal findings: Secondary | ICD-10-CM | POA: Diagnosis not present

## 2016-04-16 LAB — CBC WITH DIFFERENTIAL/PLATELET
BASOS PCT: 0.4 % (ref 0.0–3.0)
Basophils Absolute: 0 10*3/uL (ref 0.0–0.1)
EOS PCT: 1.1 % (ref 0.0–5.0)
Eosinophils Absolute: 0.1 10*3/uL (ref 0.0–0.7)
HCT: 40.7 % (ref 36.0–46.0)
HEMOGLOBIN: 13.9 g/dL (ref 12.0–15.0)
LYMPHS ABS: 1.7 10*3/uL (ref 0.7–4.0)
Lymphocytes Relative: 21.9 % (ref 12.0–46.0)
MCHC: 34 g/dL (ref 30.0–36.0)
MCV: 85.2 fl (ref 78.0–100.0)
MONO ABS: 0.6 10*3/uL (ref 0.1–1.0)
MONOS PCT: 7.2 % (ref 3.0–12.0)
NEUTROS PCT: 69.4 % (ref 43.0–77.0)
Neutro Abs: 5.4 10*3/uL (ref 1.4–7.7)
Platelets: 252 10*3/uL (ref 150.0–400.0)
RBC: 4.78 Mil/uL (ref 3.87–5.11)
RDW: 13.7 % (ref 11.5–15.5)
WBC: 7.8 10*3/uL (ref 4.0–10.5)

## 2016-04-16 LAB — LIPID PANEL
CHOLESTEROL: 154 mg/dL (ref 0–200)
HDL: 52.7 mg/dL (ref >39.00)
LDL Cholesterol: 89 mg/dL (ref 0–99)
NONHDL: 100.85
Total CHOL/HDL Ratio: 3
Triglycerides: 57 mg/dL (ref 0.0–149.0)
VLDL: 11.4 mg/dL (ref 0.0–40.0)

## 2016-04-16 LAB — HEPATIC FUNCTION PANEL
ALBUMIN: 4.1 g/dL (ref 3.5–5.2)
ALT: 11 U/L (ref 0–35)
AST: 13 U/L (ref 0–37)
Alkaline Phosphatase: 98 U/L (ref 39–117)
Bilirubin, Direct: 0.1 mg/dL (ref 0.0–0.3)
Total Bilirubin: 0.6 mg/dL (ref 0.2–1.2)
Total Protein: 6.9 g/dL (ref 6.0–8.3)

## 2016-04-16 LAB — BASIC METABOLIC PANEL
BUN: 15 mg/dL (ref 6–23)
CHLORIDE: 107 meq/L (ref 96–112)
CO2: 26 mEq/L (ref 19–32)
Calcium: 9.5 mg/dL (ref 8.4–10.5)
Creatinine, Ser: 0.82 mg/dL (ref 0.40–1.20)
GFR: 79.78 mL/min (ref >60.00)
GLUCOSE: 102 mg/dL — AB (ref 70–99)
POTASSIUM: 4.6 meq/L (ref 3.5–5.1)
SODIUM: 141 meq/L (ref 135–145)

## 2016-04-16 LAB — TSH: TSH: 1.78 u[IU]/mL (ref 0.35–4.50)

## 2016-04-25 NOTE — Progress Notes (Signed)
Chief Complaint  Patient presents with  . Annual Exam    HPI: Patient  Lauren Owens  46 y.o. comes in today for Preventive Health Care visit  shas gyne and utd on pap  Health Maintenance  Topic Date Due  . HIV Screening  05/02/1985  . INFLUENZA VACCINE  10/31/2015  . PAP SMEAR  07/31/2017  . 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 GI /GU: No adominal pain, vomiting, change in bowel habits. No blood in the stool. No significant GU symptoms. SKIN/HEME: ,no acute skin rashes suspicious lesions or bleeding. No lymphadenopathy, nodules, masses.  NEURO/ PSYCH:  No neurologic signs such as weakness numbness. No depression anxiety. IMM/ Allergy: No unusual infections.  Allergy .   REST of 12 system review negative except as per HPI   Past Medical History:  Diagnosis Date  . Blood glucose elevated    borderline  . Migraine   . 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 CESAREAN SECTION    . CHOLECYSTECTOMY      Family History  Problem Relation Age of Onset  . Hypertension    . Gallbladder disease Mother   . Heart failure Mother   . Colon cancer    . Lung cancer    . Coronary artery disease Father     in his 53's cabg x 5  . Cancer Father     Social History   Social History  . Marital status: Married    Spouse name: N/A  . Number of children: N/A  . Years of education: N/A   Social History Main Topics  . Smoking status: Never Smoker  . Smokeless tobacco:  Never Used  . Alcohol use No  . Drug use: No  . Sexual activity: Not Asked   Other Topics Concern  . 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    [place]   Commuting from [place].    Has a farm and blended family   2 dog and GP   Drinks milk and lots of caffeine   Not regular exercise   Sleep 5 hours    No outpatient prescriptions prior to visit.   No facility-administered medications prior to visit.      EXAM:  BP 122/70 (BP Location: Left Arm, Patient Position: Sitting, Cuff Size: Normal)   Pulse 83   Temp 98 F (36.7 C) (Oral)   Ht '5\' 2"'  (1.575 m)   Wt 149 lb 11.2 oz (67.9 kg)   LMP 04/21/2016   SpO2 98%   BMI 27.38 kg/m   Body mass index is 27.38 kg/m.  Physical Exam: Vital signs reviewed IRC:VELF is a well-developed well-nourished alert cooperative    who appearsr stated age in no acute distress.  HEENT: normocephalic atraumatic ,  Eyes: PERRL EOM's full, conjunctiva clear, Nares: paten,t no deformity discharge or tenderness., Ears: no deformity EAC's clear TMs with normal landmarks. Mouth: clear OP, no lesions, edema.  Moist mucous membranes. Dentition in adequate repair. NECK: supple without masses, thyromegaly or bruits. CHEST/PULM:  Clear to auscultation and percussion breath sounds equal no wheeze , rales or rhonchi. No chest wall deformities or tenderness.Breast: normal by inspection . No dimpling, discharge, masses, tenderness or discharge . CV: PMI is nondisplaced, S1 S2 no gallops, murmurs, rubs. Peripheral pulses are full without delay.No JVD .  ABDOMEN: Bowel sounds normal nontender  No guard or rebound, no hepato splenomegal no CVA tenderness.  No hernia. Extremtities:  No clubbing cyanosis or edema, no acute joint swelling or redness no focal atrophy  Few vv eins  NEURO:  Oriented x3, cranial nerves 3-12 appear to be intact, no obvious  focal weakness,gait within normal limits no abnormal reflexes or asymmetrical SKIN: No acute rashes normal turgor, color, no bruising or petechiae. PSYCH: Oriented, good eye contact, no obvious depression anxiety, cognition and judgment appear normal. LN: no cervical axillary inguinal adenopathy  Lab Results  Component Value Date   WBC 7.8 04/16/2016   HGB 13.9 04/16/2016   HCT 40.7 04/16/2016   PLT 252.0 04/16/2016   GLUCOSE 102 (H) 04/16/2016   CHOL 154 04/16/2016   TRIG 57.0 04/16/2016   HDL 52.70 04/16/2016   LDLCALC 89 04/16/2016   ALT 11 04/16/2016   AST 13 04/16/2016   NA 141 04/16/2016   K 4.6 04/16/2016   CL 107 04/16/2016   CREATININE 0.82 04/16/2016   BUN 15 04/16/2016   CO2 26 04/16/2016   TSH 1.78 04/16/2016  results reviewed with patient   ASSESSMENT AND PLAN:  Discussed the following assessment and plan:  Visit for preventive health examination  Family history of diabetes mellitus (DM) Can get flu shot at work  ? Let us know  Counseled. About healthy eating  Activity strategies  Etc   Avoiding  Dm  Mediterranean diet . Etc  Patient Care Team: Burnis Medin, MD as PCP - General Dian Queen, MD as Attending Physician (Obstetrics and Gynecology) Patient Instructions  Pay attention to lifestyle intervention healthy eating and exercise . More sleep and dec to zero  out the sugar beverages  flruti and water better than juices   Healthy lifestyle includes : At least 150 minutes of exercise weeks  , weight at healthy levels, which is usually   BMI 19-25. Avoid trans fats and processed foods;  Increase fresh fruits and veges to 5 servings per day. And avoid sweet beverages including tea and juice. Mediterranean diet with olive oil and nuts have been noted to be heart and brain healthy . Avoid tobacco products . Limit  alcohol to  7 per week for women and 14 servings for men.  Get adequate sleep . Wear seat belts . Don't text and drive .   Health  Maintenance, Female Introduction Adopting a healthy lifestyle and getting preventive care can go a long way to promote health and wellness. Talk with your health care provider about what schedule of regular examinations is right for you. This is a good chance for you to check in with your provider about disease prevention and staying healthy. In between checkups, there are plenty of things you can do on your own. Experts have done a lot of research about which lifestyle changes and preventive measures are most likely to keep you healthy. Ask your  health care provider for more information. Weight and diet Eat a healthy diet  Be sure to include plenty of vegetables, fruits, low-fat dairy products, and lean protein.  Do not eat a lot of foods high in solid fats, added sugars, or salt.  Get regular exercise. This is one of the most important things you can do for your health.  Most adults should exercise for at least 150 minutes each week. The exercise should increase your heart rate and make you sweat (moderate-intensity exercise).  Most adults should also do strengthening exercises at least twice a week. This is in addition to the moderate-intensity exercise. Maintain a healthy weight  Body mass index (BMI) is a measurement that can be used to identify possible weight problems. It estimates body fat based on height and weight. Your health care provider can help determine your BMI and help you achieve or maintain a healthy weight.  For females 88 years of age and older:  A BMI below 18.5 is considered underweight.  A BMI of 18.5 to 24.9 is normal.  A BMI of 25 to 29.9 is considered overweight.  A BMI of 30 and above is considered obese. Watch levels of cholesterol and blood lipids  You should start having your blood tested for lipids and cholesterol at 46 years of age, then have this test every 5 years.  You may need to have your cholesterol levels checked more often if:  Your lipid  or cholesterol levels are high.  You are older than 46 years of age.  You are at high risk for heart disease. Cancer screening Lung Cancer  Lung cancer screening is recommended for adults 47-4 years old who are at high risk for lung cancer because of a history of smoking.  A yearly low-dose CT scan of the lungs is recommended for people who:  Currently smoke.  Have quit within the past 15 years.  Have at least a 30-pack-year history of smoking. A pack year is smoking an average of one pack of cigarettes a day for 1 year.  Yearly screening should continue until it has been 15 years since you quit.  Yearly screening should stop if you develop a health problem that would prevent you from having lung cancer treatment. Breast Cancer  Practice breast self-awareness. This means understanding how your breasts normally appear and feel.  It also means doing regular breast self-exams. Let your health care provider know about any changes, no matter how small.  If you are in your 20s or 30s, you should have a clinical breast exam (CBE) by a health care provider every 1-3 years as part of a regular health exam.  If you are 5 or older, have a CBE every year. Also consider having a breast X-ray (mammogram) every year.  If you have a family history of breast cancer, talk to your health care provider about genetic screening.  If you are at high risk for breast cancer, talk to your health care provider about having an MRI and a mammogram every year.  Breast cancer gene (BRCA) assessment is recommended for women who have family members with BRCA-related cancers. BRCA-related cancers include:  Breast.  Ovarian.  Tubal.  Peritoneal cancers.  Results of the assessment will determine the need for genetic counseling and BRCA1 and BRCA2 testing. Cervical Cancer  Your health care provider may recommend that you be screened regularly for cancer of the pelvic organs (ovaries, uterus, and vagina).  This screening involves a pelvic examination, including checking  for microscopic changes to the surface of your cervix (Pap test). You may be encouraged to have this screening done every 3 years, beginning at age 7.  For women ages 76-65, health care providers may recommend pelvic exams and Pap testing every 3 years, or they may recommend the Pap and pelvic exam, combined with testing for human papilloma virus (HPV), every 5 years. Some types of HPV increase your risk of cervical cancer. Testing for HPV may also be done on women of any age with unclear Pap test results.  Other health care providers may not recommend any screening for nonpregnant women who are considered low risk for pelvic cancer and who do not have symptoms. Ask your health care provider if a screening pelvic exam is right for you.  If you have had past treatment for cervical cancer or a condition that could lead to cancer, you need Pap tests and screening for cancer for at least 20 years after your treatment. If Pap tests have been discontinued, your risk factors (such as having a new sexual partner) need to be reassessed to determine if screening should resume. Some women have medical problems that increase the chance of getting cervical cancer. In these cases, your health care provider may recommend more frequent screening and Pap tests. Colorectal Cancer  This type of cancer can be detected and often prevented.  Routine colorectal cancer screening usually begins at 46 years of age and continues through 46 years of age.  Your health care provider may recommend screening at an earlier age if you have risk factors for colon cancer.  Your health care provider may also recommend using home test kits to check for hidden blood in the stool.  A small camera at the end of a tube can be used to examine your colon directly (sigmoidoscopy or colonoscopy). This is done to check for the earliest forms of colorectal cancer.  Routine  screening usually begins at age 45.  Direct examination of the colon should be repeated every 5-10 years through 46 years of age. However, you may need to be screened more often if early forms of precancerous polyps or small growths are found. Skin Cancer  Check your skin from head to toe regularly.  Tell your health care provider about any new moles or changes in moles, especially if there is a change in a mole's shape or color.  Also tell your health care provider if you have a mole that is larger than the size of a pencil eraser.  Always use sunscreen. Apply sunscreen liberally and repeatedly throughout the day.  Protect yourself by wearing long sleeves, pants, a wide-brimmed hat, and sunglasses whenever you are outside. Heart disease, diabetes, and high blood pressure  High blood pressure causes heart disease and increases the risk of stroke. High blood pressure is more likely to develop in:  People who have blood pressure in the high end of the normal range (130-139/85-89 mm Hg).  People who are overweight or obese.  People who are African American.  If you are 71-66 years of age, have your blood pressure checked every 3-5 years. If you are 43 years of age or older, have your blood pressure checked every year. You should have your blood pressure measured twice-once when you are at a hospital or clinic, and once when you are not at a hospital or clinic. Record the average of the two measurements. To check your blood pressure when you are not at a hospital or clinic, you  can use:  An automated blood pressure machine at a pharmacy.  A home blood pressure monitor.  If you are between 72 years and 14 years old, ask your health care provider if you should take aspirin to prevent strokes.  Have regular diabetes screenings. This involves taking a blood sample to check your fasting blood sugar level.  If you are at a normal weight and have a low risk for diabetes, have this test once  every three years after 46 years of age.  If you are overweight and have a high risk for diabetes, consider being tested at a younger age or more often. Preventing infection Hepatitis B  If you have a higher risk for hepatitis B, you should be screened for this virus. You are considered at high risk for hepatitis B if:  You were born in a country where hepatitis B is common. Ask your health care provider which countries are considered high risk.  Your parents were born in a high-risk country, and you have not been immunized against hepatitis B (hepatitis B vaccine).  You have HIV or AIDS.  You use needles to inject street drugs.  You live with someone who has hepatitis B.  You have had sex with someone who has hepatitis B.  You get hemodialysis treatment.  You take certain medicines for conditions, including cancer, organ transplantation, and autoimmune conditions. Hepatitis C  Blood testing is recommended for:  Everyone born from [birth year] through 1965.  Anyone with known risk factors for hepatitis C. Sexually transmitted infections (STIs)  You should be screened for sexually transmitted infections (STIs) including gonorrhea and chlamydia if:  You are sexually active and are younger than 46 years of age.  You are older than 46 years of age and your health care provider tells you that you are at risk for this type of infection.  Your sexual activity has changed since you were last screened and you are at an increased risk for chlamydia or gonorrhea. Ask your health care provider if you are at risk.  If you do not have HIV, but are at risk, it may be recommended that you take a prescription medicine daily to prevent HIV infection. This is called pre-exposure prophylaxis (PrEP). You are considered at risk if:  You are sexually active and do not regularly use condoms or know the HIV status of your partner(s).  You take drugs by injection.  You are sexually active with a partner  who has HIV. Talk with your health care provider about whether you are at high risk of being infected with HIV. If you choose to begin PrEP, you should first be tested for HIV. You should then be tested every 3 months for as long as you are taking PrEP. Pregnancy  If you are premenopausal and you may become pregnant, ask your health care provider about preconception counseling.  If you may become pregnant, take 400 to 800 micrograms (mcg) of folic acid every day.  If you want to prevent pregnancy, talk to your health care provider about birth control (contraception). Osteoporosis and menopause  Osteoporosis is a disease in which the bones lose minerals and strength with aging. This can result in serious bone fractures. Your risk for osteoporosis can be identified using a bone density scan.  If you are 65 years of age or older, or if you are at risk for osteoporosis and fractures, ask your health care provider if you should be screened.  Ask your health care provider whether  you should take a calcium or vitamin D supplement to lower your risk for osteoporosis.  Menopause may have certain physical symptoms and risks.  Hormone replacement therapy may reduce some of these symptoms and risks. Talk to your health care provider about whether hormone replacement therapy is right for you. Follow these instructions at home:  Schedule regular health, dental, and eye exams.  Stay current with your immunizations.  Do not use any tobacco products including cigarettes, chewing tobacco, or electronic cigarettes.  If you are pregnant, do not drink alcohol.  If you are breastfeeding, limit how much and how often you drink alcohol.  Limit alcohol intake to no more than 1 drink per day for nonpregnant women. One drink equals 12 ounces of beer, 5 ounces of wine, or 1 ounces of hard liquor.  Do not use street drugs.  Do not share needles.  Ask your health care provider for help if you need support  or information about quitting drugs.  Tell your health care provider if you often feel depressed.  Tell your health care provider if you have ever been abused or do not feel safe at home. This information is not intended to replace advice given to you by your health care provider. Make sure you discuss any questions you have with your health care provider. Document Released: 10/01/2010 Document Revised: 08/24/2015 Document Reviewed: 12/20/2014  2017 [employer]      Mariann Laster K. Panosh M.D.

## 2016-04-26 ENCOUNTER — Ambulatory Visit (INDEPENDENT_AMBULATORY_CARE_PROVIDER_SITE_OTHER): Payer: Commercial Managed Care - PPO | Admitting: Internal Medicine

## 2016-04-26 ENCOUNTER — Encounter: Payer: Self-pay | Admitting: Internal Medicine

## 2016-04-26 VITALS — BP 122/70 | HR 83 | Temp 98.0°F | Ht 62.0 in | Wt 149.7 lb

## 2016-04-26 DIAGNOSIS — Z0000 Encounter for general adult medical examination without abnormal findings: Secondary | ICD-10-CM

## 2016-04-26 DIAGNOSIS — Z833 Family history of diabetes mellitus: Secondary | ICD-10-CM | POA: Diagnosis not present

## 2016-04-26 NOTE — Patient Instructions (Addendum)
Pay attention to lifestyle intervention healthy eating and exercise . More sleep and dec to zero  out the sugar beverages  flruti and water better than juices   Healthy lifestyle includes : At least 150 minutes of exercise weeks  , weight at healthy levels, which is usually   BMI 19-25. Avoid trans fats and processed foods;  Increase fresh fruits and veges to 5 servings per day. And avoid sweet beverages including tea and juice. Mediterranean diet with olive oil and nuts have been noted to be heart and brain healthy . Avoid tobacco products . Limit  alcohol to  7 per week for women and 14 servings for men.  Get adequate sleep . Wear seat belts . Don't text and drive .   Health Maintenance, Female Introduction Adopting a healthy lifestyle and getting preventive care can go a long way to promote health and wellness. Talk with your health care provider about what schedule of regular examinations is right for you. This is a good chance for you to check in with your provider about disease prevention and staying healthy. In between checkups, there are plenty of things you can do on your own. Experts have done a lot of research about which lifestyle changes and preventive measures are most likely to keep you healthy. Ask your health care provider for more information. Weight and diet Eat a healthy diet  Be sure to include plenty of vegetables, fruits, low-fat dairy products, and lean protein.  Do not eat a lot of foods high in solid fats, added sugars, or salt.  Get regular exercise. This is one of the most important things you can do for your health.  Most adults should exercise for at least 150 minutes each week. The exercise should increase your heart rate and make you sweat (moderate-intensity exercise).  Most adults should also do strengthening exercises at least twice a week. This is in addition to the moderate-intensity exercise. Maintain a healthy weight  Body mass index (BMI) is a  measurement that can be used to identify possible weight problems. It estimates body fat based on height and weight. Your health care provider can help determine your BMI and help you achieve or maintain a healthy weight.  For females 60 years of age and older:  A BMI below 18.5 is considered underweight.  A BMI of 18.5 to 24.9 is normal.  A BMI of 25 to 29.9 is considered overweight.  A BMI of 30 and above is considered obese. Watch levels of cholesterol and blood lipids  You should start having your blood tested for lipids and cholesterol at 46 years of age, then have this test every 5 years.  You may need to have your cholesterol levels checked more often if:  Your lipid or cholesterol levels are high.  You are older than 46 years of age.  You are at high risk for heart disease. Cancer screening Lung Cancer  Lung cancer screening is recommended for adults 50-52 years old who are at high risk for lung cancer because of a history of smoking.  A yearly low-dose CT scan of the lungs is recommended for people who:  Currently smoke.  Have quit within the past 15 years.  Have at least a 30-pack-year history of smoking. A pack year is smoking an average of one pack of cigarettes a day for 1 year.  Yearly screening should continue until it has been 15 years since you quit.  Yearly screening should stop if you develop  a health problem that would prevent you from having lung cancer treatment. Breast Cancer  Practice breast self-awareness. This means understanding how your breasts normally appear and feel.  It also means doing regular breast self-exams. Let your health care provider know about any changes, no matter how small.  If you are in your 20s or 30s, you should have a clinical breast exam (CBE) by a health care provider every 1-3 years as part of a regular health exam.  If you are 67 or older, have a CBE every year. Also consider having a breast X-ray (mammogram) every  year.  If you have a family history of breast cancer, talk to your health care provider about genetic screening.  If you are at high risk for breast cancer, talk to your health care provider about having an MRI and a mammogram every year.  Breast cancer gene (BRCA) assessment is recommended for women who have family members with BRCA-related cancers. BRCA-related cancers include:  Breast.  Ovarian.  Tubal.  Peritoneal cancers.  Results of the assessment will determine the need for genetic counseling and BRCA1 and BRCA2 testing. Cervical Cancer  Your health care provider may recommend that you be screened regularly for cancer of the pelvic organs (ovaries, uterus, and vagina). This screening involves a pelvic examination, including checking for microscopic changes to the surface of your cervix (Pap test). You may be encouraged to have this screening done every 3 years, beginning at age 70.  For women ages 35-65, health care providers may recommend pelvic exams and Pap testing every 3 years, or they may recommend the Pap and pelvic exam, combined with testing for human papilloma virus (HPV), every 5 years. Some types of HPV increase your risk of cervical cancer. Testing for HPV may also be done on women of any age with unclear Pap test results.  Other health care providers may not recommend any screening for nonpregnant women who are considered low risk for pelvic cancer and who do not have symptoms. Ask your health care provider if a screening pelvic exam is right for you.  If you have had past treatment for cervical cancer or a condition that could lead to cancer, you need Pap tests and screening for cancer for at least 20 years after your treatment. If Pap tests have been discontinued, your risk factors (such as having a new sexual partner) need to be reassessed to determine if screening should resume. Some women have medical problems that increase the chance of getting cervical cancer. In  these cases, your health care provider may recommend more frequent screening and Pap tests. Colorectal Cancer  This type of cancer can be detected and often prevented.  Routine colorectal cancer screening usually begins at 46 years of age and continues through 46 years of age.  Your health care provider may recommend screening at an earlier age if you have risk factors for colon cancer.  Your health care provider may also recommend using home test kits to check for hidden blood in the stool.  A small camera at the end of a tube can be used to examine your colon directly (sigmoidoscopy or colonoscopy). This is done to check for the earliest forms of colorectal cancer.  Routine screening usually begins at age 2.  Direct examination of the colon should be repeated every 5-10 years through 46 years of age. However, you may need to be screened more often if early forms of precancerous polyps or small growths are found. Skin Cancer  Check your skin from head to toe regularly.  Tell your health care provider about any new moles or changes in moles, especially if there is a change in a mole's shape or color.  Also tell your health care provider if you have a mole that is larger than the size of a pencil eraser.  Always use sunscreen. Apply sunscreen liberally and repeatedly throughout the day.  Protect yourself by wearing long sleeves, pants, a wide-brimmed hat, and sunglasses whenever you are outside. Heart disease, diabetes, and high blood pressure  High blood pressure causes heart disease and increases the risk of stroke. High blood pressure is more likely to develop in:  People who have blood pressure in the high end of the normal range (130-139/85-89 mm Hg).  People who are overweight or obese.  People who are African American.  If you are 87-90 years of age, have your blood pressure checked every 3-5 years. If you are 72 years of age or older, have your blood pressure checked  every year. You should have your blood pressure measured twice-once when you are at a hospital or clinic, and once when you are not at a hospital or clinic. Record the average of the two measurements. To check your blood pressure when you are not at a hospital or clinic, you can use:  An automated blood pressure machine at a pharmacy.  A home blood pressure monitor.  If you are between 35 years and 81 years old, ask your health care provider if you should take aspirin to prevent strokes.  Have regular diabetes screenings. This involves taking a blood sample to check your fasting blood sugar level.  If you are at a normal weight and have a low risk for diabetes, have this test once every three years after 46 years of age.  If you are overweight and have a high risk for diabetes, consider being tested at a younger age or more often. Preventing infection Hepatitis B  If you have a higher risk for hepatitis B, you should be screened for this virus. You are considered at high risk for hepatitis B if:  You were born in a country where hepatitis B is common. Ask your health care provider which countries are considered high risk.  Your parents were born in a high-risk country, and you have not been immunized against hepatitis B (hepatitis B vaccine).  You have HIV or AIDS.  You use needles to inject street drugs.  You live with someone who has hepatitis B.  You have had sex with someone who has hepatitis B.  You get hemodialysis treatment.  You take certain medicines for conditions, including cancer, organ transplantation, and autoimmune conditions. Hepatitis C  Blood testing is recommended for:  Everyone born from [birth year] through 1965.  Anyone with known risk factors for hepatitis C. Sexually transmitted infections (STIs)  You should be screened for sexually transmitted infections (STIs) including gonorrhea and chlamydia if:  You are sexually active and are younger than 46 years of  age.  You are older than 46 years of age and your health care provider tells you that you are at risk for this type of infection.  Your sexual activity has changed since you were last screened and you are at an increased risk for chlamydia or gonorrhea. Ask your health care provider if you are at risk.  If you do not have HIV, but are at risk, it may be recommended that you take a prescription medicine daily to  prevent HIV infection. This is called pre-exposure prophylaxis (PrEP). You are considered at risk if:  You are sexually active and do not regularly use condoms or know the HIV status of your partner(s).  You take drugs by injection.  You are sexually active with a partner who has HIV. Talk with your health care provider about whether you are at high risk of being infected with HIV. If you choose to begin PrEP, you should first be tested for HIV. You should then be tested every 3 months for as long as you are taking PrEP. Pregnancy  If you are premenopausal and you may become pregnant, ask your health care provider about preconception counseling.  If you may become pregnant, take 400 to 800 micrograms (mcg) of folic acid every day.  If you want to prevent pregnancy, talk to your health care provider about birth control (contraception). Osteoporosis and menopause  Osteoporosis is a disease in which the bones lose minerals and strength with aging. This can result in serious bone fractures. Your risk for osteoporosis can be identified using a bone density scan.  If you are 8 years of age or older, or if you are at risk for osteoporosis and fractures, ask your health care provider if you should be screened.  Ask your health care provider whether you should take a calcium or vitamin D supplement to lower your risk for osteoporosis.  Menopause may have certain physical symptoms and risks.  Hormone replacement therapy may reduce some of these symptoms and risks. Talk to your health  care provider about whether hormone replacement therapy is right for you. Follow these instructions at home:  Schedule regular health, dental, and eye exams.  Stay current with your immunizations.  Do not use any tobacco products including cigarettes, chewing tobacco, or electronic cigarettes.  If you are pregnant, do not drink alcohol.  If you are breastfeeding, limit how much and how often you drink alcohol.  Limit alcohol intake to no more than 1 drink per day for nonpregnant women. One drink equals 12 ounces of beer, 5 ounces of wine, or 1 ounces of hard liquor.  Do not use street drugs.  Do not share needles.  Ask your health care provider for help if you need support or information about quitting drugs.  Tell your health care provider if you often feel depressed.  Tell your health care provider if you have ever been abused or do not feel safe at home. This information is not intended to replace advice given to you by your health care provider. Make sure you discuss any questions you have with your health care provider. Document Released: 10/01/2010 Document Revised: 08/24/2015 Document Reviewed: 12/20/2014  2017 [employer]

## 2016-12-20 ENCOUNTER — Encounter: Payer: Self-pay | Admitting: Internal Medicine

## 2017-07-29 DIAGNOSIS — Z6827 Body mass index (BMI) 27.0-27.9, adult: Secondary | ICD-10-CM | POA: Diagnosis not present

## 2017-07-29 DIAGNOSIS — Z01419 Encounter for gynecological examination (general) (routine) without abnormal findings: Secondary | ICD-10-CM | POA: Diagnosis not present

## 2017-07-29 DIAGNOSIS — Z1231 Encounter for screening mammogram for malignant neoplasm of breast: Secondary | ICD-10-CM | POA: Diagnosis not present

## 2017-08-20 ENCOUNTER — Ambulatory Visit: Payer: Commercial Managed Care - PPO | Admitting: Internal Medicine

## 2017-08-20 ENCOUNTER — Encounter: Payer: Self-pay | Admitting: Internal Medicine

## 2017-08-20 VITALS — BP 128/78 | HR 69 | Temp 98.2°F | Resp 14 | Wt 154.0 lb

## 2017-08-20 DIAGNOSIS — R590 Localized enlarged lymph nodes: Secondary | ICD-10-CM | POA: Diagnosis not present

## 2017-08-20 MED ORDER — CEPHALEXIN 500 MG PO CAPS: 500.0000 mg | ORAL_CAPSULE | Freq: Two times a day (BID) | ORAL | 0 refills | Status: AC

## 2017-08-20 NOTE — Progress Notes (Signed)
Chief Complaint  Patient presents with  . Cyst    Knot on L neck. First noticed on Monday and has grown since then. Tender to palpation.    HPI: Lauren Owens 47 y.o.  sda   3 days ago did have a sore bump onscalp  Keeps nair short and then drained  . Then 2 days ago  Tender lump left   Post neck  Increase  Yesterday    Some better today but checking out  stil tender  ROS: See pertinent positives and negatives per HPI. No systemic sx   Fever chills rashes  Past Medical History:  Diagnosis Date  . Blood glucose elevated    borderline  . Migraine   . 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 Hypertension Unknown   . Gallbladder disease Mother   . Heart failure Mother   . Colon cancer Unknown   . Lung cancer Unknown   . Coronary artery disease Father        in his 84's cabg x 5  . Cancer Father     Social History   Socioeconomic History  . Marital status: Married    Spouse name: Not on file  . Number of children: Not on file  . Years of education: Not on file  . Highest education level: Not on file  Occupational History  . Not on file  Social Needs  . Financial resource strain: Not on file  . Food insecurity:    Worry: Not on file    Inability: Not on file  . Transportation needs:    Medical: Not on file    Non-medical: Not on file  Tobacco Use  . Smoking status: Never Smoker  . Smokeless tobacco: Never Used  Substance and Sexual Activity  . Alcohol use: No  . Drug use: No  . Sexual activity: Not on file  Lifestyle  . Physical activity:    Days per week: Not on file    Minutes per session: Not on file  . Stress: Not on file  Relationships  . Social connections:    Talks on phone: Not on file    Gets together: Not on file    Attends religious service: Not on file    Active member of club or organization: Not on file    Attends meetings of clubs or organizations: Not on file      Relationship status: Not on file  Other Topics Concern  . 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    [place]   Commuting from [place].    Has a farm and blended family   2 dog and GP   Drinks milk and lots of caffeine   Not regular exercise   Sleep 5 hours    Outpatient Medications Prior to Visit  Medication Sig Dispense Refill  . Multiple Vitamins-Minerals (CENTRUM WOMEN PO) Take 1 tablet by mouth daily.    . norethindrone (MICRONOR,CAMILA,ERRIN) 0.35 MG tablet Take 1 tablet by mouth daily.  11   No facility-administered medications prior to visit.      EXAM:  BP 128/78 (BP Location: Right Arm, Patient Position: Sitting, Cuff Size: Normal)   Pulse 69   Temp  98.2 F (36.8 C) (Oral)   Resp 14   Wt 154 lb (69.9 kg)   LMP 08/11/2017 (Approximate)   SpO2 98%   BMI 28.17 kg/m   Body mass index is 28.17 kg/m.  GENERAL: vitals reviewed and listed above, alert, oriented, appears well hydrated and in no acute distress HEENT: atraumatic, conjunctiva  clear, no obvious abnormalities on inspection of external nose and ears OP : no lesion edema or exudate  Skin clear but faded red spot left post scalp   No  Pustule   Vesicle or induration  NECK: left  pc node tender  1+ cm  No redness  No other nodes  [place] axillary  Inguinal  Abdomen:  Sof,t normal bowel sounds without hepatosplenomegaly, no guarding rebound or masses no CVA tenderness LUNGS: clear to auscultation bilaterally, no wheezes, rales or rhonchi, good air movement CV: HRRR, no clubbing cyanosis or  peripheral edema nl cap refill  MS: moves all extremities without noticeable focal  Abnormality PSYCH: pleasant and cooperative, no obvious depression or anxiety  ASSESSMENT AND PLAN:  Discussed the following assessment and plan:  Reactive cervical lymphadenopathy Most likely a benign process   Reaction  Expectant  If  Not continuing to improve can add anti biotic .  -Patient advised to return or notify health care team  if symptoms worsen ,persist or new concerns arise.  Patient Instructions  I think this is a reaction   To skin infection reaction.   And if not improving take the antibiotic .     Standley Brooking. Lauren Owens M.D.

## 2017-08-20 NOTE — Patient Instructions (Addendum)
I think this is a reaction   To skin infection reaction.   And if not improving take the antibiotic .

## 2018-03-02 DIAGNOSIS — N924 Excessive bleeding in the premenopausal period: Secondary | ICD-10-CM | POA: Diagnosis not present

## 2018-03-02 DIAGNOSIS — N921 Excessive and frequent menstruation with irregular cycle: Secondary | ICD-10-CM | POA: Diagnosis not present

## 2018-10-20 NOTE — Progress Notes (Signed)
Chief Complaint  Patient presents with  . Annual Exam    No new concerns today     HPI: Patient  Lauren Owens  48 y.o. comes in today for Preventive Health Care visit   Doing well  Sees gyne is perimenopauseal    Getting menopausal sx and some adult acne  Health Maintenance  Topic Date Due  . HIV Screening  05/02/1985  . PAP SMEAR-Modifier  10/21/2018 (Originally 07/31/2017)  . INFLUENZA VACCINE  10/31/2018  . TETANUS/TDAP  02/01/2024   Health Maintenance Review LIFESTYLE:  Exercise:   [place]  Tobacco/ETS: no Alcohol:  n Sugar beverages: juice   oj  Sleep:5-8  Drug use: no HH of   2 dog 1 cat   hh of 3  Work: back to work   Midwife school  Around 60 - 22   Periods   ireg now   Adult acne   Dr Helane Rima  .     ROS:  GEN/ HEENT: No fever, significant weight changes sweats headaches vision problems hearing changes, CV/ PULM; No chest pain shortness of breath cough, syncope,edema  change in exercise tolerance. GI /GU: No adominal pain, vomiting, change in bowel habits. No blood in the stool. No significant GU symptoms. SKIN/HEME: ,no acute skin rashes suspicious lesions or bleeding. No lymphadenopathy, nodules, masses.  NEURO/ PSYCH:  No neurologic signs such as weakness numbness. No depression anxiety. IMM/ Allergy: No unusual infections.  Allergy .   REST of 12 system review negative except as per HPI   Past Medical History:  Diagnosis Date  . Blood glucose elevated    borderline  . Migraine   . 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 CESAREAN SECTION    . CHOLECYSTECTOMY      Family History  Problem Relation Age of Onset  . Hypertension Unknown   . Gallbladder disease Mother   . Heart failure Mother   . Colon cancer Unknown   . Lung cancer Unknown   . Coronary artery disease Father        in his 68's cabg x 5  . Cancer Father    mom had colon cancer   But passed from chf    Father had renal cancer   Social History   Socioeconomic History  . Marital status: Married    Spouse name: Not on file  . Number of children: Not on file  . Years of education: Not on file  . Highest education level: Not on file  Occupational History  . Not on file  Social Needs  . Financial resource strain: Not on file  . Food insecurity    Worry: Not on file    Inability: Not on file  . Transportation needs    Medical: Not on file    Non-medical: Not on file  Tobacco Use  . Smoking status: Never Smoker  . Smokeless tobacco: Never Used  Substance and Sexual Activity  . Alcohol use: No  . Drug use: No  . Sexual activity: Not on file  Lifestyle  . Physical activity    Days per week: Not on file    Minutes per session: Not on file  . Stress: Not on file  Relationships  . Social connections    Talks on phone: Not on file  Gets together: Not on file    Attends religious service: Not on file    Active member of club or organization: Not on file    Attends meetings of clubs or organizations: Not on file    Relationship status: Not on file  Other Topics Concern  . 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    [place]   Commuting from [place].    Has a farm and blended family   2 dog and GP   Drinks milk and lots of caffeine   Not regular exercise   Sleep 5 hours    Outpatient Medications Prior to Visit  Medication Sig Dispense Refill  . Multiple Vitamins-Minerals (CENTRUM WOMEN PO) Take 1 tablet by mouth daily.    . norethindrone (MICRONOR,CAMILA,ERRIN) 0.35 MG tablet Take 1 tablet by mouth daily.  11   No facility-administered medications prior to visit.      EXAM:  BP 120/68 (BP Location: Right Arm, Patient Position: Sitting, Cuff Size: Normal)   Pulse 75   Temp 98.9 F (37.2 C)  (Temporal)   Ht '5\' 2"'  (1.575 m)   Wt 155 lb (70.3 kg)   LMP 10/07/2018   SpO2 99%   BMI 28.35 kg/m   Body mass index is 28.35 kg/m. Wt Readings from Last 3 Encounters:  10/21/18 155 lb (70.3 kg)  08/20/17 154 lb (69.9 kg)  04/26/16 149 lb 11.2 oz (67.9 kg)    Physical Exam: Vital signs reviewed MGQ:QPYP is a well-developed well-nourished alert cooperative    who appearsr stated age in no acute distress.  HEENT: normocephalic atraumatic , Eyes: PERRL EOM's full, conjunctiva clear, Nares: paten,t no deformity discharge or tenderness., Ears: no deformity EAC's clear TMs with normal landmarks. Mouth: clear OP, deferred mask says has partials from gum disease  Lower  NECK: supple without masses, thyromegaly or bruits. CHEST/PULM:  Clear to auscultation and percussion breath sounds equal no wheeze , rales or rhonchi. No chest wall deformities or tenderness. Breast: normal by inspection . No dimpling, discharge, masses, tenderness or discharge . CV: PMI is nondisplaced, S1 S2 no gallops, murmurs, rubs. Peripheral pulses are full without delay.No JVD .  ABDOMEN: Bowel sounds normal nontender  No guard or rebound, no hepato splenomegal no CVA tenderness.  No hernia. Extremtities:  No clubbing cyanosis or edema, no acute joint swelling or redness no focal atrophy  fe vv  NEURO:  Oriented x3, cranial nerves 3-12 appear to be intact, no obvious focal weakness,gait within normal limits no abnormal reflexes or asymmetrical SKIN: No acute rashes normal turgor, color, no bruising or petechiae. 2 mm fibroma like  Lesion left lateral calf with some scale  PSYCH: Oriented, good eye contact, no obvious depression anxiety, cognition and judgment appear normal. LN: no cervical axillary inguinal adenopathy  Lab Results  Component Value Date   WBC 7.8 04/16/2016   HGB 13.9 04/16/2016   HCT 40.7 04/16/2016   PLT 252.0 04/16/2016   GLUCOSE 102 (H) 04/16/2016   CHOL 154 04/16/2016   TRIG 57.0 04/16/2016    HDL 52.70 04/16/2016   LDLCALC 89 04/16/2016   ALT 11 04/16/2016   AST 13 04/16/2016   NA 141 04/16/2016   K 4.6 04/16/2016   CL 107 04/16/2016   CREATININE 0.82 04/16/2016   BUN 15 04/16/2016  CO2 26 04/16/2016   TSH 1.78 04/16/2016    BP Readings from Last 3 Encounters:  10/21/18 120/68  08/20/17 128/78  04/26/16 122/70    Lab plan reviewed with patient   ASSESSMENT AND PLAN:  Discussed the following assessment and plan:    ICD-10-CM   1. Visit for preventive health examination  E08.14 Basic metabolic panel    CBC with Differential/Platelet    Hemoglobin A1c    Hepatic function panel    Lipid panel    TSH  2. Hyperglycemia  R73.9 Hemoglobin A1c  3. Family history of colon cancer in mother  G81.8 Basic metabolic panel    CBC with Differential/Platelet    Hepatic function panel    Lipid panel    TSH  is fasting today   Plan lab and a1c    Cancer screening  Counseled.   And  Prevention of cv disaseas and chf as much possible  Monitor skin are and if changing can see derm or [place]    Patient Care Team: Valerie Fredin, Standley Brooking, MD as PCP - General Dian Queen, MD as Attending Physician (Obstetrics and Gynecology) Patient Instructions  Continue lifestyle intervention healthy eating and exercise . To   Be heart healthy.   Make sure get colon cancer screening colonscopy   Age 55  . We can do a referral or you can make your own GI appt.   Will notify you  of labs when available.     Preventive Care 36-66 Years Old, Female Preventive care refers to visits with your health care provider and lifestyle choices that can promote health and wellness. This includes:  A yearly physical exam. This may also be called an annual well check.  Regular dental visits and eye exams.  Immunizations.  Screening for certain conditions.  Healthy lifestyle choices, such as eating a healthy diet, getting regular exercise, not using drugs or products that contain nicotine and tobacco,  and limiting alcohol use. What can I expect for my preventive care visit? Physical exam Your health care provider will check your:  Height and weight. This may be used to calculate body mass index (BMI), which tells if you are at a healthy weight.  Heart rate and blood pressure.  Skin for abnormal spots. Counseling Your health care provider may ask you questions about your:  Alcohol, tobacco, and drug use.  Emotional well-being.  Home and relationship well-being.  Sexual activity.  Eating habits.  Work and work Statistician.  Method of birth control.  Menstrual cycle.  Pregnancy history. What immunizations do I need?  Influenza (flu) vaccine  This is recommended every year. Tetanus, diphtheria, and pertussis (Tdap) vaccine  You may need a Td booster every 10 years. Varicella (chickenpox) vaccine  You may need this if you have not been vaccinated. Zoster (shingles) vaccine  You may need this after age 3. Measles, mumps, and rubella (MMR) vaccine  You may need at least one dose of MMR if you were born in 1957 or later. You may also need a second dose. Pneumococcal conjugate (PCV13) vaccine  You may need this if you have certain conditions and were not previously vaccinated. Pneumococcal polysaccharide (PPSV23) vaccine  You may need one or two doses if you smoke cigarettes or if you have certain conditions. Meningococcal conjugate (MenACWY) vaccine  You may need this if you have certain conditions. Hepatitis A vaccine  You may need this if you have certain conditions or if you travel or work in places  where you may be exposed to hepatitis A. Hepatitis B vaccine  You may need this if you have certain conditions or if you travel or work in places where you may be exposed to hepatitis B. Haemophilus influenzae type b (Hib) vaccine  You may need this if you have certain conditions. Human papillomavirus (HPV) vaccine  If recommended by your health care  provider, you may need three doses over 6 months. You may receive vaccines as individual doses or as more than one vaccine together in one shot (combination vaccines). Talk with your health care provider about the risks and benefits of combination vaccines. What tests do I need? Blood tests  Lipid and cholesterol levels. These may be checked every 5 years, or more frequently if you are over 60 years old.  Hepatitis C test.  Hepatitis B test. Screening  Lung cancer screening. You may have this screening every year starting at age 74 if you have a 30-pack-year history of smoking and currently smoke or have quit within the past 15 years.  Colorectal cancer screening. All adults should have this screening starting at age 29 and continuing until age 103. Your health care provider may recommend screening at age 40 if you are at increased risk. You will have tests every 1-10 years, depending on your results and the type of screening test.  Diabetes screening. This is done by checking your blood sugar (glucose) after you have not eaten for a while (fasting). You may have this done every 1-3 years.  Mammogram. This may be done every 1-2 years. Talk with your health care provider about when you should start having regular mammograms. This may depend on whether you have a family history of breast cancer.  BRCA-related cancer screening. This may be done if you have a family history of breast, ovarian, tubal, or peritoneal cancers.  Pelvic exam and Pap test. This may be done every 3 years starting at age 31. Starting at age 64, this may be done every 5 years if you have a Pap test in combination with an HPV test. Other tests  Sexually transmitted disease (STD) testing.  Bone density scan. This is done to screen for osteoporosis. You may have this scan if you are at high risk for osteoporosis. Follow these instructions at home: Eating and drinking  Eat a diet that includes fresh fruits and  vegetables, whole grains, lean protein, and low-fat dairy.  Take vitamin and mineral supplements as recommended by your health care provider.  Do not drink alcohol if: ? Your health care provider tells you not to drink. ? You are pregnant, may be pregnant, or are planning to become pregnant.  If you drink alcohol: ? Limit how much you have to 0-1 drink a day. ? Be aware of how much alcohol is in your drink. In the [place] one drink equals one 12 oz bottle of beer (355 mL), one 5 oz glass of wine (148 mL), or one 1 oz glass of hard liquor (44 mL). Lifestyle  Take daily care of your teeth and gums.  Stay active. Exercise for at least 30 minutes on 5 or more days each week.  Do not use any products that contain nicotine or tobacco, such as cigarettes, e-cigarettes, and chewing tobacco. If you need help quitting, ask your health care provider.  If you are sexually active, practice safe sex. Use a condom or other form of birth control (contraception) in order to prevent pregnancy and STIs (sexually transmitted infections).  If told by your health care provider, take low-dose aspirin daily starting at age 81. What's next?  Visit your health care provider once a year for a well check visit.  Ask your health care provider how often you should have your eyes and teeth checked.  Stay up to date on all vaccines. This information is not intended to replace advice given to you by your health care provider. Make sure you discuss any questions you have with your health care provider. Document Released: 04/14/2015 Document Revised: 11/27/2017 Document Reviewed: 11/27/2017 Elsevier Patient Education  2020 [place] Sourish Allender M.D.

## 2018-10-21 ENCOUNTER — Encounter: Payer: Self-pay | Admitting: Internal Medicine

## 2018-10-21 ENCOUNTER — Ambulatory Visit (INDEPENDENT_AMBULATORY_CARE_PROVIDER_SITE_OTHER): Payer: Commercial Managed Care - PPO | Admitting: Internal Medicine

## 2018-10-21 VITALS — BP 120/68 | HR 75 | Temp 98.9°F | Ht 62.0 in | Wt 155.0 lb

## 2018-10-21 DIAGNOSIS — Z800 Family history of malignant neoplasm of digestive organs: Secondary | ICD-10-CM | POA: Diagnosis not present

## 2018-10-21 DIAGNOSIS — Z0000 Encounter for general adult medical examination without abnormal findings: Secondary | ICD-10-CM

## 2018-10-21 DIAGNOSIS — R739 Hyperglycemia, unspecified: Secondary | ICD-10-CM | POA: Diagnosis not present

## 2018-10-21 LAB — BASIC METABOLIC PANEL
BUN: 14 mg/dL (ref 6–23)
CO2: 26 mEq/L (ref 19–32)
Calcium: 9.5 mg/dL (ref 8.4–10.5)
Chloride: 105 mEq/L (ref 96–112)
Creatinine, Ser: 0.7 mg/dL (ref 0.40–1.20)
GFR: 89.13 mL/min (ref >60.00)
Glucose, Bld: 76 mg/dL (ref 70–99)
Potassium: 4.3 mEq/L (ref 3.5–5.1)
Sodium: 139 mEq/L (ref 135–145)

## 2018-10-21 LAB — HEPATIC FUNCTION PANEL
ALT: 13 U/L (ref 0–35)
AST: 14 U/L (ref 0–37)
Albumin: 4 g/dL (ref 3.5–5.2)
Alkaline Phosphatase: 94 U/L (ref 39–117)
Bilirubin, Direct: 0.1 mg/dL (ref 0.0–0.3)
Total Bilirubin: 0.4 mg/dL (ref 0.2–1.2)
Total Protein: 7.2 g/dL (ref 6.0–8.3)

## 2018-10-21 LAB — CBC WITH DIFFERENTIAL/PLATELET
Basophils Absolute: 0 10*3/uL (ref 0.0–0.1)
Basophils Relative: 0.3 % (ref 0.0–3.0)
Eosinophils Absolute: 0.1 10*3/uL (ref 0.0–0.7)
Eosinophils Relative: 0.6 % (ref 0.0–5.0)
HCT: 42.3 % (ref 36.0–46.0)
Hemoglobin: 13.7 g/dL (ref 12.0–15.0)
Lymphocytes Relative: 20.5 % (ref 12.0–46.0)
Lymphs Abs: 1.8 10*3/uL (ref 0.7–4.0)
MCHC: 32.3 g/dL (ref 30.0–36.0)
MCV: 88.8 fl (ref 78.0–100.0)
Monocytes Absolute: 0.5 10*3/uL (ref 0.1–1.0)
Monocytes Relative: 5.9 % (ref 3.0–12.0)
Neutro Abs: 6.4 10*3/uL (ref 1.4–7.7)
Neutrophils Relative %: 72.7 % (ref 43.0–77.0)
Platelets: 297 10*3/uL (ref 150.0–400.0)
RBC: 4.76 Mil/uL (ref 3.87–5.11)
RDW: 14.3 % (ref 11.5–15.5)
WBC: 8.8 10*3/uL (ref 4.0–10.5)

## 2018-10-21 LAB — LIPID PANEL
Cholesterol: 195 mg/dL (ref 0–200)
HDL: 69 mg/dL (ref >39.00)
LDL Cholesterol: 113 mg/dL — ABNORMAL HIGH (ref 0–99)
NonHDL: 126.05
Total CHOL/HDL Ratio: 3
Triglycerides: 65 mg/dL (ref 0.0–149.0)
VLDL: 13 mg/dL (ref 0.0–40.0)

## 2018-10-21 LAB — HEMOGLOBIN A1C: Hgb A1c MFr Bld: 5.7 % (ref 4.6–6.5)

## 2018-10-21 LAB — TSH: TSH: 1.46 u[IU]/mL (ref 0.35–4.50)

## 2018-10-21 NOTE — Patient Instructions (Signed)
Continue lifestyle intervention healthy eating and exercise . To   Be heart healthy.   Make sure get colon cancer screening colonscopy   Age 48  . We can do a referral or you can make your own GI appt.   Will notify you  of labs when available.     Preventive Care 75-29 Years Old, Female Preventive care refers to visits with your health care provider and lifestyle choices that can promote health and wellness. This includes:  A yearly physical exam. This may also be called an annual well check.  Regular dental visits and eye exams.  Immunizations.  Screening for certain conditions.  Healthy lifestyle choices, such as eating a healthy diet, getting regular exercise, not using drugs or products that contain nicotine and tobacco, and limiting alcohol use. What can I expect for my preventive care visit? Physical exam Your health care provider will check your:  Height and weight. This may be used to calculate body mass index (BMI), which tells if you are at a healthy weight.  Heart rate and blood pressure.  Skin for abnormal spots. Counseling Your health care provider may ask you questions about your:  Alcohol, tobacco, and drug use.  Emotional well-being.  Home and relationship well-being.  Sexual activity.  Eating habits.  Work and work Statistician.  Method of birth control.  Menstrual cycle.  Pregnancy history. What immunizations do I need?  Influenza (flu) vaccine  This is recommended every year. Tetanus, diphtheria, and pertussis (Tdap) vaccine  You may need a Td booster every 10 years. Varicella (chickenpox) vaccine  You may need this if you have not been vaccinated. Zoster (shingles) vaccine  You may need this after age 38. Measles, mumps, and rubella (MMR) vaccine  You may need at least one dose of MMR if you were born in 1957 or later. You may also need a second dose. Pneumococcal conjugate (PCV13) vaccine  You may need this if you have certain  conditions and were not previously vaccinated. Pneumococcal polysaccharide (PPSV23) vaccine  You may need one or two doses if you smoke cigarettes or if you have certain conditions. Meningococcal conjugate (MenACWY) vaccine  You may need this if you have certain conditions. Hepatitis A vaccine  You may need this if you have certain conditions or if you travel or work in places where you may be exposed to hepatitis A. Hepatitis B vaccine  You may need this if you have certain conditions or if you travel or work in places where you may be exposed to hepatitis B. Haemophilus influenzae type b (Hib) vaccine  You may need this if you have certain conditions. Human papillomavirus (HPV) vaccine  If recommended by your health care provider, you may need three doses over 6 months. You may receive vaccines as individual doses or as more than one vaccine together in one shot (combination vaccines). Talk with your health care provider about the risks and benefits of combination vaccines. What tests do I need? Blood tests  Lipid and cholesterol levels. These may be checked every 5 years, or more frequently if you are over 73 years old.  Hepatitis C test.  Hepatitis B test. Screening  Lung cancer screening. You may have this screening every year starting at age 53 if you have a 30-pack-year history of smoking and currently smoke or have quit within the past 15 years.  Colorectal cancer screening. All adults should have this screening starting at age 31 and continuing until age 45. Your health  care provider may recommend screening at age 65 if you are at increased risk. You will have tests every 1-10 years, depending on your results and the type of screening test.  Diabetes screening. This is done by checking your blood sugar (glucose) after you have not eaten for a while (fasting). You may have this done every 1-3 years.  Mammogram. This may be done every 1-2 years. Talk with your health care  provider about when you should start having regular mammograms. This may depend on whether you have a family history of breast cancer.  BRCA-related cancer screening. This may be done if you have a family history of breast, ovarian, tubal, or peritoneal cancers.  Pelvic exam and Pap test. This may be done every 3 years starting at age 27. Starting at age 58, this may be done every 5 years if you have a Pap test in combination with an HPV test. Other tests  Sexually transmitted disease (STD) testing.  Bone density scan. This is done to screen for osteoporosis. You may have this scan if you are at high risk for osteoporosis. Follow these instructions at home: Eating and drinking  Eat a diet that includes fresh fruits and vegetables, whole grains, lean protein, and low-fat dairy.  Take vitamin and mineral supplements as recommended by your health care provider.  Do not drink alcohol if: ? Your health care provider tells you not to drink. ? You are pregnant, may be pregnant, or are planning to become pregnant.  If you drink alcohol: ? Limit how much you have to 0-1 drink a day. ? Be aware of how much alcohol is in your drink. In the [place] one drink equals one 12 oz bottle of beer (355 mL), one 5 oz glass of wine (148 mL), or one 1 oz glass of hard liquor (44 mL). Lifestyle  Take daily care of your teeth and gums.  Stay active. Exercise for at least 30 minutes on 5 or more days each week.  Do not use any products that contain nicotine or tobacco, such as cigarettes, e-cigarettes, and chewing tobacco. If you need help quitting, ask your health care provider.  If you are sexually active, practice safe sex. Use a condom or other form of birth control (contraception) in order to prevent pregnancy and STIs (sexually transmitted infections).  If told by your health care provider, take low-dose aspirin daily starting at age 83. What's next?  Visit your health care provider once a year for a  well check visit.  Ask your health care provider how often you should have your eyes and teeth checked.  Stay up to date on all vaccines. This information is not intended to replace advice given to you by your health care provider. Make sure you discuss any questions you have with your health care provider. Document Released: 04/14/2015 Document Revised: 11/27/2017 Document Reviewed: 11/27/2017 Elsevier Patient Education  2020 [employer].

## 2018-12-01 NOTE — Progress Notes (Signed)
Chief Complaint  Patient presents with  . Arm Pain    left arm pt states that she was walking her dog that it jerked her pt has had swelling in left arm and pain pt states that this happened about a week ago     HPI: Lauren Owens 48 y.o. come in for sda  Last week was walking dog and unexpectedly jerked her left arm forward  ( leash hand)     No pop but became sore the next day and since then tender at lateral elbow  hurts to  Fully flex and rotate but no weakness or numbness  Using ice    Remote hx of elbow fx when younger  Is right hand dominant.  ROS: See pertinent positives and negatives per HPI.  Past Medical History:  Diagnosis Date  . Blood glucose elevated    borderline  . Migraine   . 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 Hypertension Unknown   . Gallbladder disease Mother   . Heart failure Mother   . Colon cancer Unknown   . Lung cancer Unknown   . Coronary artery disease Father        in his 64's cabg x 5  . Cancer Father     Social History   Socioeconomic History  . Marital status: Married    Spouse name: Not on file  . Number of children: Not on file  . Years of education: Not on file  . Highest education level: Not on file  Occupational History  . Not on file  Social Needs  . Financial resource strain: Not on file  . Food insecurity    Worry: Not on file    Inability: Not on file  . Transportation needs    Medical: Not on file    Non-medical: Not on file  Tobacco Use  . Smoking status: Never Smoker  . Smokeless tobacco: Never Used  Substance and Sexual Activity  . Alcohol use: No  . Drug use: No  . Sexual activity: Not on file  Lifestyle  . Physical activity    Days per week: Not on file    Minutes per session: Not on file  . Stress: Not on file  Relationships  . Social Herbalist on phone: Not on file    Gets together: Not on file    Attends  religious service: Not on file    Active member of club or organization: Not on file    Attends meetings of clubs or organizations: Not on file    Relationship status: Not on file  Other Topics Concern  . 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    [place]   Commuting from [place].    Has a farm and blended family   2 dog and GP   Drinks milk and lots of caffeine   Not regular exercise   Sleep 5 hours    Outpatient Medications Prior to Visit  Medication Sig Dispense Refill  . Multiple Vitamins-Minerals (CENTRUM WOMEN PO) Take 1 tablet by mouth daily.    . norethindrone (MICRONOR,CAMILA,ERRIN) 0.35 MG tablet Take 1 tablet by mouth daily.  11   No facility-administered  medications prior to visit.      EXAM:  BP 136/82 (BP Location: Right Arm, Patient Position: Sitting, Cuff Size: Normal)   Pulse 90   Temp 98.2 F (36.8 C) (Temporal)   Wt 155 lb 6.4 oz (70.5 kg)   SpO2 95%   BMI 28.42 kg/m   Body mass index is 28.42 kg/m.  GENERAL: vitals reviewed and listed above, alert, oriented, appears well hydrated and in no acute distress HEENT: atraumatic, conjunctiva  clear, no obvious abnormalities on inspection of external nose and ears  NECK: no obvious masses on inspection palpation  MS: moves all extremities without noticeable focal  Abnormality  Tender at   Lateral epicondylar area and Br area  No crepitus or bruising  NV ok   Grip st nl  tener more with active flexion   extension is ok   No deformity  Or dislocation PSYCH: pleasant and cooperative, no obvious depression or anxiety  BP Readings from Last 3 Encounters:  12/02/18 136/82  10/21/18 120/68  08/20/17 128/78    ASSESSMENT AND PLAN:  Discussed the following assessment and plan:  Arm injury, right, initial encounter - ice topicals nsiad if needed relative rest  strap of brace and  consider  SM referral if not improving  Mechanism of injury not c/w  overuse epicondylitis but disc anatomy  And are Of area of pain -Patient advised to return or notify health care team  if  new concerns arise.  Patient Instructions  conitnue ice  relative rest    Topical voltaren is otc and can use 4 x per day  If needed can take aleve 1-2 twice a day if pain  Needed.   Add cho type strap to see if helps pain and stabilized movement in the  Muscle tendon.   It may take  Weeks to get totally better but  If not  Improvement  Next wee advise we get sports medicine to give opinoin.     Tennis Elbow  Tennis elbow (lateral epicondylitis) is inflammation of tendons in your outer forearm, near your elbow. Tendons are tissues that connect muscle to bone. When you have tennis elbow, inflammation affects the tendons that you use to bend your wrist and move your hand up. Inflammation occurs in the lower part of the upper arm bone (humerus), where the tendons connect to the bone (lateral epicondyle). Tennis elbow often affects people who play tennis, but anyone may get the condition from repeatedly extending the wrist or turning the forearm. What are the causes? This condition is usually caused by repeatedly extending the wrist, turning the forearm, and using the hands. It can result from sports or work that requires repetitive forearm movements. In some cases, it may be caused by a sudden injury. What increases the risk? You are more likely to develop tennis elbow if you play tennis or another racket sport. You also have a higher risk if you frequently use your hands for work. Besides people who play tennis, others at greater risk include:  Musicians.  Carpenters, painters, and plumbers.  Cooks.  Cashiers.  People who work in [employer].  Architect workers.  Butchers.  People who use computers. What are the signs or symptoms? Symptoms of this condition include:  Pain and tenderness in the  forearm and the outer part of the elbow. Pain may be felt only when using the arm, or it may be there all the time.  A burning feeling that starts in the elbow and spreads  down the forearm.  A weak grip in the hand. How is this diagnosed? This condition may be diagnosed based on:  Your symptoms and medical history.  A physical exam.  X-rays.  MRI. How is this treated? Resting and icing your arm is often the first treatment. Your health care provider may also recommend:  Medicines to reduce pain and inflammation. These may be in the form of a pill, topical gels, or shots of a steroid medicine (cortisone).  An elbow strap to reduce stress on the area.  Physical therapy. This may include massage or exercises.  An elbow brace to restrict the movements that cause symptoms. If these treatments do not help relieve your symptoms, your health care provider may recommend surgery to remove damaged muscle and reattach healthy muscle to bone. Follow these instructions at home: Activity  Rest your elbow and wrist and avoid activities that cause symptoms, as told by your health care provider.  Do physical therapy exercises as instructed.  If you lift an object, lift it with your palm facing up. This reduces stress on your elbow. Lifestyle  If your tennis elbow is caused by sports, check your equipment and make sure that: ? You are using it correctly. ? It is the best fit for you.  If your tennis elbow is caused by work or computer use, take frequent breaks to stretch your arm. Talk with your manager about ways to manage your condition at work. If you have a brace:  Wear the brace or strap as told by your health care provider. Remove it only as told by your health care provider.  Loosen the brace if your fingers tingle, become numb, or turn cold and blue.  Keep the brace clean.  If the brace is not waterproof, ask if you may remove it for bathing. If you must keep the brace on while  bathing: ? Do not let it get wet. ? Cover it with a watertight covering when you take a bath or a shower. General instructions   If directed, put ice on the painful area: ? Put ice in a plastic bag. ? Place a towel between your skin and the bag. ? Leave the ice on for 20 minutes, 2-3 times a day.  Take over-the-counter and prescription medicines only as told by your health care provider.  Keep all follow-up visits as told by your health care provider. This is important. Contact a health care provider if:  You have pain that gets worse or does not get better with treatment.  You have numbness or weakness in your forearm, hand, or fingers. Summary  Tennis elbow (lateral epicondylitis) is inflammation of tendons in your outer forearm, near your elbow.  Common symptoms include pain and tenderness in your forearm and the outer part of your elbow.  This condition is usually caused by repeatedly extending your wrist, turning your forearm, and using your hands.  The first treatment is often resting and icing your arm to relieve symptoms. Further treatment may include taking medicine, getting physical therapy, wearing a brace or strap, or having surgery. This information is not intended to replace advice given to you by your health care provider. Make sure you discuss any questions you have with your health care provider. Document Released: 03/18/2005 Document Revised: 12/12/2017 Document Reviewed: 12/31/2016 Elsevier Patient Education  2020 [place] Zaharah Amir M.D.

## 2018-12-02 ENCOUNTER — Ambulatory Visit (INDEPENDENT_AMBULATORY_CARE_PROVIDER_SITE_OTHER): Payer: Commercial Managed Care - PPO | Admitting: Internal Medicine

## 2018-12-02 ENCOUNTER — Encounter: Payer: Self-pay | Admitting: Internal Medicine

## 2018-12-02 VITALS — BP 136/82 | HR 90 | Temp 98.2°F | Wt 155.4 lb

## 2018-12-02 DIAGNOSIS — S4991XA Unspecified injury of right shoulder and upper arm, initial encounter: Secondary | ICD-10-CM

## 2018-12-02 NOTE — Patient Instructions (Addendum)
conitnue ice  relative rest    Topical voltaren is otc and can use 4 x per day  If needed can take aleve 1-2 twice a day if pain  Needed.   Add cho type strap to see if helps pain and stabilized movement in the  Muscle tendon.   It may take  Weeks to get totally better but  If not  Improvement  Next wee advise we get sports medicine to give opinoin.     Tennis Elbow  Tennis elbow (lateral epicondylitis) is inflammation of tendons in your outer forearm, near your elbow. Tendons are tissues that connect muscle to bone. When you have tennis elbow, inflammation affects the tendons that you use to bend your wrist and move your hand up. Inflammation occurs in the lower part of the upper arm bone (humerus), where the tendons connect to the bone (lateral epicondyle). Tennis elbow often affects people who play tennis, but anyone may get the condition from repeatedly extending the wrist or turning the forearm. What are the causes? This condition is usually caused by repeatedly extending the wrist, turning the forearm, and using the hands. It can result from sports or work that requires repetitive forearm movements. In some cases, it may be caused by a sudden injury. What increases the risk? You are more likely to develop tennis elbow if you play tennis or another racket sport. You also have a higher risk if you frequently use your hands for work. Besides people who play tennis, others at greater risk include:  Musicians.  Carpenters, painters, and plumbers.  Cooks.  Cashiers.  People who work in [employer].  Architect workers.  Butchers.  People who use computers. What are the signs or symptoms? Symptoms of this condition include:  Pain and tenderness in the forearm and the outer part of the elbow. Pain may be felt only when using the arm, or it may be there all the time.  A burning feeling that starts in the elbow and spreads down the forearm.  A weak grip in the hand. How is this  diagnosed? This condition may be diagnosed based on:  Your symptoms and medical history.  A physical exam.  X-rays.  MRI. How is this treated? Resting and icing your arm is often the first treatment. Your health care provider may also recommend:  Medicines to reduce pain and inflammation. These may be in the form of a pill, topical gels, or shots of a steroid medicine (cortisone).  An elbow strap to reduce stress on the area.  Physical therapy. This may include massage or exercises.  An elbow brace to restrict the movements that cause symptoms. If these treatments do not help relieve your symptoms, your health care provider may recommend surgery to remove damaged muscle and reattach healthy muscle to bone. Follow these instructions at home: Activity  Rest your elbow and wrist and avoid activities that cause symptoms, as told by your health care provider.  Do physical therapy exercises as instructed.  If you lift an object, lift it with your palm facing up. This reduces stress on your elbow. Lifestyle  If your tennis elbow is caused by sports, check your equipment and make sure that: ? You are using it correctly. ? It is the best fit for you.  If your tennis elbow is caused by work or computer use, take frequent breaks to stretch your arm. Talk with your manager about ways to manage your condition at work. If you have a brace:  Wear the brace or strap as told by your health care provider. Remove it only as told by your health care provider.  Loosen the brace if your fingers tingle, become numb, or turn cold and blue.  Keep the brace clean.  If the brace is not waterproof, ask if you may remove it for bathing. If you must keep the brace on while bathing: ? Do not let it get wet. ? Cover it with a watertight covering when you take a bath or a shower. General instructions   If directed, put ice on the painful area: ? Put ice in a plastic bag. ? Place a towel between  your skin and the bag. ? Leave the ice on for 20 minutes, 2-3 times a day.  Take over-the-counter and prescription medicines only as told by your health care provider.  Keep all follow-up visits as told by your health care provider. This is important. Contact a health care provider if:  You have pain that gets worse or does not get better with treatment.  You have numbness or weakness in your forearm, hand, or fingers. Summary  Tennis elbow (lateral epicondylitis) is inflammation of tendons in your outer forearm, near your elbow.  Common symptoms include pain and tenderness in your forearm and the outer part of your elbow.  This condition is usually caused by repeatedly extending your wrist, turning your forearm, and using your hands.  The first treatment is often resting and icing your arm to relieve symptoms. Further treatment may include taking medicine, getting physical therapy, wearing a brace or strap, or having surgery. This information is not intended to replace advice given to you by your health care provider. Make sure you discuss any questions you have with your health care provider. Document Released: 03/18/2005 Document Revised: 12/12/2017 Document Reviewed: 12/31/2016 Elsevier Patient Education  2020 [employer].

## 2019-01-19 ENCOUNTER — Other Ambulatory Visit: Payer: Self-pay

## 2019-01-19 ENCOUNTER — Ambulatory Visit (INDEPENDENT_AMBULATORY_CARE_PROVIDER_SITE_OTHER): Payer: Commercial Managed Care - PPO

## 2019-01-19 DIAGNOSIS — Z23 Encounter for immunization: Secondary | ICD-10-CM | POA: Diagnosis not present

## 2019-10-25 NOTE — Progress Notes (Signed)
Chief Complaint  Patient presents with  . Annual Exam    Doing okay  . Weight Gain    Wants to discuss weight loss options    HPI: Patient  Lauren Owens  49 y.o. comes in today for Preventive Health Care visit  Has gained weight and has  Hot flushes   In past months  Never had issues fam hx of diabetes   Health Maintenance  Topic Date Due  . COVID-19 Vaccine (1) Never done  . HIV Screening  Never done  . PAP SMEAR-Modifier  07/31/2017  . INFLUENZA VACCINE  10/31/2019  . TETANUS/TDAP  02/01/2024  . Hepatitis C Screening  Completed   Health Maintenance Review LIFESTYLE:  Exercise:    Dog walk  Tobacco/ETS: no Alcohol:  No Sugar beverages: none since April sodas  1/2 sweet tea.  Sleep:  5.5-6 hours   Some hot flushes a problem .  irreg periods skipping.  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 GI /GU: No adominal pain, vomiting, change in bowel habits. No blood in the stool. No significant GU symptoms. SKIN/HEME: ,no acute skin rashes suspicious lesions or bleeding. No lymphadenopathy, nodules, masses.  NEURO/ PSYCH:  No neurologic signs such as weakness numbness. No depression anxiety. IMM/ Allergy: No unusual infections.  Allergy .   REST of 12 system review negative except as per HPI   Past Medical History:  Diagnosis Date  . Blood glucose elevated    borderline  . Migraine   . 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 CESAREAN SECTION    . CHOLECYSTECTOMY      Family History  Problem Relation Age of Onset  . Hypertension Other   . Gallbladder disease Mother   . Heart failure Mother   . Colon cancer Other    . Lung cancer Other   . Coronary artery disease Father        in his 5's cabg x 5  . Cancer Father     Social History   Socioeconomic History  . Marital status: Married    Spouse name: Not on file  . Number of children: Not on file  . Years of education: Not on file  . Highest education level: Not on file  Occupational History  . Not on file  Tobacco Use  . Smoking status: Never Smoker  . Smokeless tobacco: Never Used  Vaping Use  . Vaping Use: Never used  Substance and Sexual Activity  . Alcohol use: No  . Drug use: No  . Sexual activity: Not on file  Other Topics Concern  . 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    [place]   Commuting from [place].    Has a farm and blended family   2 dog and GP   Drinks milk and lots of caffeine   Not regular exercise   Sleep 5 hours  Social Determinants of Health   Financial Resource Strain:   . Difficulty of Paying Living Expenses:   Food Insecurity:   . Worried About Charity fundraiser in the Last Year:   . Arboriculturist in the Last Year:   Transportation Needs:   . Film/video editor (Medical):   Marland Kitchen Lack of Transportation (Non-Medical):   Physical Activity:   . Days of Exercise per Week:   . Minutes of Exercise per Session:   Stress:   . Feeling of Stress :   Social Connections:   . Frequency of Communication with Friends and Family:   . Frequency of Social Gatherings with Friends and Family:   . Attends Religious Services:   . Active Member of Clubs or Organizations:   . Attends Archivist Meetings:   Marland Kitchen Marital Status:     Outpatient Medications Prior to Visit  Medication Sig Dispense Refill  . Multiple Vitamins-Minerals (CENTRUM WOMEN PO) Take 1 tablet by mouth daily.    . norethindrone (MICRONOR,CAMILA,ERRIN) 0.35 MG tablet Take 1 tablet by mouth daily. (Patient not  taking: Reported on 10/26/2019)  11   No facility-administered medications prior to visit.     EXAM:  BP 126/82   Pulse 71   Temp 97.8 F (36.6 C) (Oral)   Ht 5' 2.5" (1.588 m)   Wt 164 lb 3.2 oz (74.5 kg)   BMI 29.55 kg/m   Body mass index is 29.55 kg/m. Wt Readings from Last 3 Encounters:  10/26/19 164 lb 3.2 oz (74.5 kg)  12/02/18 155 lb 6.4 oz (70.5 kg)  10/21/18 155 lb (70.3 kg)    Physical Exam: Vital signs reviewed XTG:GYIR is a well-developed well-nourished alert cooperative    who appearsr stated age in no acute distress.  HEENT: normocephalic atraumatic , Eyes: PERRL EOM's full, conjunctiva clear, Nares: paten,t no deformity discharge or tenderness., Ears: no deformity EAC's clear TMs with normal landmarks. Mouth: masked  NECK: supple without masses, thyromegaly or bruits. CHEST/PULM:  Clear to auscultation and percussion breath sounds equal no wheeze , rales or rhonchi. No chest wall deformities or tenderness. Breast: normal by inspection . No dimpling, discharge, masses, tenderness or discharge . CV: PMI is nondisplaced, S1 S2 no gallops, murmurs, rubs. Peripheral pulses are full without delay.No JVD .  ABDOMEN: Bowel sounds normal nontender  No guard or rebound, no hepato splenomegal no CVA tenderness.  No hernia. Extremtities:  No clubbing cyanosis or edema, no acute joint swelling or redness no focal atrophy NEURO:  Oriented x3, cranial nerves 3-12 appear to be intact, no obvious focal weakness,gait within normal limits no abnormal reflexes or asymmetrical SKIN: No acute rashes normal turgor, color, no bruising or petechiae. PSYCH: Oriented, good eye contact, no obvious depression anxiety, cognition and judgment appear normal. LN: no cervical axillary inguinal adenopathy  Lab Results  Component Value Date   WBC 8.5 10/26/2019   HGB 13.3 10/26/2019   HCT 40.5 10/26/2019   PLT 303 10/26/2019   GLUCOSE 99 10/26/2019   CHOL 178 10/26/2019   TRIG 85 10/26/2019    HDL 56 10/26/2019   LDLCALC 104 (H) 10/26/2019   ALT 14 10/26/2019   AST 15 10/26/2019   NA 140 10/26/2019   K 4.1 10/26/2019   CL 106 10/26/2019   CREATININE 0.73 10/26/2019   BUN 12 10/26/2019   CO2 27 10/26/2019   TSH 1.82 10/26/2019   HGBA1C 5.6 10/26/2019    BP Readings from Last 3  Encounters:  10/26/19 126/82  12/02/18 136/82  10/21/18 120/68    Lab plan reviewed  iewed with patient  Has had covid vaccine  ASSESSMENT AND PLAN:  Discussed the following assessment and plan:    ICD-10-CM   1. Visit for preventive health examination  Z00.00 CBC with Differential/Platelet    Hemoglobin A1c    Hepatic function panel    Basic metabolic panel    Lipid panel    TSH    T4, free    T4, free    TSH    Lipid panel    Basic metabolic panel    Hepatic function panel    Hemoglobin A1c    CBC with Differential/Platelet  2. Hyperglycemia  R73.9 CBC with Differential/Platelet    Hemoglobin A1c    Hepatic function panel    Basic metabolic panel    Lipid panel    TSH    T4, free    T4, free    TSH    Lipid panel    Basic metabolic panel    Hepatic function panel    Hemoglobin A1c    CBC with Differential/Platelet  3. Perimenopause  N95.1 CBC with Differential/Platelet    Hemoglobin A1c    Hepatic function panel    Basic metabolic panel    Lipid panel    TSH    T4, free    T4, free    TSH    Lipid panel    Basic metabolic panel    Hepatic function panel    Hemoglobin A1c    CBC with Differential/Platelet  4. Weight gain  R63.5 CBC with Differential/Platelet    Hemoglobin A1c    Hepatic function panel    Basic metabolic panel    Lipid panel    TSH    T4, free    T4, free    TSH    Lipid panel    Basic metabolic panel    Hepatic function panel    Hemoglobin A1c    CBC with Differential/Platelet  5. Family history of diabetes mellitus  Z83.3 CBC with Differential/Platelet    Hemoglobin A1c    Hepatic function panel    Basic metabolic panel     Lipid panel    TSH    T4, free    T4, free    TSH    Lipid panel    Basic metabolic panel    Hepatic function panel    Hemoglobin A1c    CBC with Differential/Platelet  6. Need for hepatitis C screening test  Z11.59 Hepatitis C antibody    Hepatitis C antibody  7. Colon cancer screening  Z12.11 Ambulatory referral to Gastroenterology  8. Screen for colon cancer  Z12.11 Ambulatory referral to Gastroenterology  9. Family history of colon cancer  Z80.0 Ambulatory referral to Gastroenterology   Return for 2-3 mos virtual ok if  ytou wnat to discuss  weight  control . Hep c   Colon screen  Plan fu depending or yearly  Disc perimenopause  Patient Care Team: Yoniel Arkwright, Standley Brooking, MD as PCP - General Dian Queen, MD as Attending Physician (Obstetrics and Gynecology) Patient Instructions   Will notify you  of labs when available.  Track all intake  and activity as possible and sleep. Drink more water . dont get over hungry    Higher fiber foods better Sugar carbs make you hungrier.   Nix calories in drinks x low fat milk. Read labels  And portion sizes  Track acitivty to be able to increase. Add resistance training  Talk with gyne about menopausal sx .   Plan fu if want to discuss weight  Control.    Health Maintenance, Female Adopting a healthy lifestyle and getting preventive care are important in promoting health and wellness. Ask your health care provider about:  The right schedule for you to have regular tests and exams.  Things you can do on your own to prevent diseases and keep yourself healthy. What should I know about diet, weight, and exercise? Eat a healthy diet   Eat a diet that includes plenty of vegetables, fruits, low-fat dairy products, and lean protein.  Do not eat a lot of foods that are high in solid fats, added sugars, or sodium. Maintain a healthy weight Body mass index (BMI) is used to identify weight problems. It estimates body fat based on height  and weight. Your health care provider can help determine your BMI and help you achieve or maintain a healthy weight. Get regular exercise Get regular exercise. This is one of the most important things you can do for your health. Most adults should:  Exercise for at least 150 minutes each week. The exercise should increase your heart rate and make you sweat (moderate-intensity exercise).  Do strengthening exercises at least twice a week. This is in addition to the moderate-intensity exercise.  Spend less time sitting. Even light physical activity can be beneficial. Watch cholesterol and blood lipids Have your blood tested for lipids and cholesterol at 49 years of age, then have this test every 5 years. Have your cholesterol levels checked more often if:  Your lipid or cholesterol levels are high.  You are older than 49 years of age.  You are at high risk for heart disease. What should I know about cancer screening? Depending on your health history and family history, you may need to have cancer screening at various ages. This may include screening for:  Breast cancer.  Cervical cancer.  Colorectal cancer.  Skin cancer.  Lung cancer. What should I know about heart disease, diabetes, and high blood pressure? Blood pressure and heart disease  High blood pressure causes heart disease and increases the risk of stroke. This is more likely to develop in people who have high blood pressure readings, are of African descent, or are overweight.  Have your blood pressure checked: ? Every 3-5 years if you are 84-48 years of age. ? Every year if you are 91 years old or older. Diabetes Have regular diabetes screenings. This checks your fasting blood sugar level. Have the screening done:  Once every three years after age 66 if you are at a normal weight and have a low risk for diabetes.  More often and at a younger age if you are overweight or have a high risk for diabetes. What should I  know about preventing infection? Hepatitis B If you have a higher risk for hepatitis B, you should be screened for this virus. Talk with your health care provider to find out if you are at risk for hepatitis B infection. Hepatitis C Testing is recommended for:  Everyone born from [birth year] through 1965.  Anyone with known risk factors for hepatitis C. Sexually transmitted infections (STIs)  Get screened for STIs, including gonorrhea and chlamydia, if: ? You are sexually active and are younger than 49 years of age. ? You are older than 49 years of age and your health care provider tells you that you are at  risk for this type of infection. ? Your sexual activity has changed since you were last screened, and you are at increased risk for chlamydia or gonorrhea. Ask your health care provider if you are at risk.  Ask your health care provider about whether you are at high risk for HIV. Your health care provider may recommend a prescription medicine to help prevent HIV infection. If you choose to take medicine to prevent HIV, you should first get tested for HIV. You should then be tested every 3 months for as long as you are taking the medicine. Pregnancy  If you are about to stop having your period (premenopausal) and you may become pregnant, seek counseling before you get pregnant.  Take 400 to 800 micrograms (mcg) of folic acid every day if you become pregnant.  Ask for birth control (contraception) if you want to prevent pregnancy. Osteoporosis and menopause Osteoporosis is a disease in which the bones lose minerals and strength with aging. This can result in bone fractures. If you are 73 years old or older, or if you are at risk for osteoporosis and fractures, ask your health care provider if you should:  Be screened for bone loss.  Take a calcium or vitamin D supplement to lower your risk of fractures.  Be given hormone replacement therapy (HRT) to treat symptoms of menopause. Follow these  instructions at home: Lifestyle  Do not use any products that contain nicotine or tobacco, such as cigarettes, e-cigarettes, and chewing tobacco. If you need help quitting, ask your health care provider.  Do not use street drugs.  Do not share needles.  Ask your health care provider for help if you need support or information about quitting drugs. Alcohol use  Do not drink alcohol if: ? Your health care provider tells you not to drink. ? You are pregnant, may be pregnant, or are planning to become pregnant.  If you drink alcohol: ? Limit how much you use to 0-1 drink a day. ? Limit intake if you are breastfeeding.  Be aware of how much alcohol is in your drink. In the [place] one drink equals one 12 oz bottle of beer (355 mL), one 5 oz glass of wine (148 mL), or one 1 oz glass of hard liquor (44 mL). General instructions  Schedule regular health, dental, and eye exams.  Stay current with your vaccines.  Tell your health care provider if: ? You often feel depressed. ? You have ever been abused or do not feel safe at home. Summary  Adopting a healthy lifestyle and getting preventive care are important in promoting health and wellness.  Follow your health care provider's instructions about healthy diet, exercising, and getting tested or screened for diseases.  Follow your health care provider's instructions on monitoring your cholesterol and blood pressure. This information is not intended to replace advice given to you by your health care provider. Make sure you discuss any questions you have with your health care provider. Document Revised: 03/11/2018 Document Reviewed: 03/11/2018 Elsevier Patient Education  2020 [employer] Ronneisha Jett M.D.

## 2019-10-26 ENCOUNTER — Encounter: Payer: Self-pay | Admitting: Internal Medicine

## 2019-10-26 ENCOUNTER — Ambulatory Visit (INDEPENDENT_AMBULATORY_CARE_PROVIDER_SITE_OTHER): Payer: Commercial Managed Care - PPO | Admitting: Internal Medicine

## 2019-10-26 ENCOUNTER — Other Ambulatory Visit: Payer: Self-pay

## 2019-10-26 VITALS — BP 126/82 | HR 71 | Temp 97.8°F | Ht 62.5 in | Wt 164.2 lb

## 2019-10-26 DIAGNOSIS — R739 Hyperglycemia, unspecified: Secondary | ICD-10-CM

## 2019-10-26 DIAGNOSIS — R635 Abnormal weight gain: Secondary | ICD-10-CM

## 2019-10-26 DIAGNOSIS — Z0000 Encounter for general adult medical examination without abnormal findings: Secondary | ICD-10-CM | POA: Diagnosis not present

## 2019-10-26 DIAGNOSIS — Z800 Family history of malignant neoplasm of digestive organs: Secondary | ICD-10-CM

## 2019-10-26 DIAGNOSIS — Z1211 Encounter for screening for malignant neoplasm of colon: Secondary | ICD-10-CM

## 2019-10-26 DIAGNOSIS — N951 Menopausal and female climacteric states: Secondary | ICD-10-CM

## 2019-10-26 DIAGNOSIS — Z833 Family history of diabetes mellitus: Secondary | ICD-10-CM

## 2019-10-26 DIAGNOSIS — Z1159 Encounter for screening for other viral diseases: Secondary | ICD-10-CM

## 2019-10-26 NOTE — Patient Instructions (Signed)
Will notify you  of labs when available.  Track all intake  and activity as possible and sleep. Drink more water . dont get over hungry    Higher fiber foods better Sugar carbs make you hungrier.   Nix calories in drinks x low fat milk. Read labels  And portion sizes Track acitivty to be able to increase. Add resistance training  Talk with gyne about menopausal sx .   Plan fu if want to discuss weight  Control.    Health Maintenance, Female Adopting a healthy lifestyle and getting preventive care are important in promoting health and wellness. Ask your health care provider about:  The right schedule for you to have regular tests and exams.  Things you can do on your own to prevent diseases and keep yourself healthy. What should I know about diet, weight, and exercise? Eat a healthy diet   Eat a diet that includes plenty of vegetables, fruits, low-fat dairy products, and lean protein.  Do not eat a lot of foods that are high in solid fats, added sugars, or sodium. Maintain a healthy weight Body mass index (BMI) is used to identify weight problems. It estimates body fat based on height and weight. Your health care provider can help determine your BMI and help you achieve or maintain a healthy weight. Get regular exercise Get regular exercise. This is one of the most important things you can do for your health. Most adults should:  Exercise for at least 150 minutes each week. The exercise should increase your heart rate and make you sweat (moderate-intensity exercise).  Do strengthening exercises at least twice a week. This is in addition to the moderate-intensity exercise.  Spend less time sitting. Even light physical activity can be beneficial. Watch cholesterol and blood lipids Have your blood tested for lipids and cholesterol at 49 years of age, then have this test every 5 years. Have your cholesterol levels checked more often if:  Your lipid or cholesterol levels are  high.  You are older than 49 years of age.  You are at high risk for heart disease. What should I know about cancer screening? Depending on your health history and family history, you may need to have cancer screening at various ages. This may include screening for:  Breast cancer.  Cervical cancer.  Colorectal cancer.  Skin cancer.  Lung cancer. What should I know about heart disease, diabetes, and high blood pressure? Blood pressure and heart disease  High blood pressure causes heart disease and increases the risk of stroke. This is more likely to develop in people who have high blood pressure readings, are of African descent, or are overweight.  Have your blood pressure checked: ? Every 3-5 years if you are 82-67 years of age. ? Every year if you are 48 years old or older. Diabetes Have regular diabetes screenings. This checks your fasting blood sugar level. Have the screening done:  Once every three years after age 69 if you are at a normal weight and have a low risk for diabetes.  More often and at a younger age if you are overweight or have a high risk for diabetes. What should I know about preventing infection? Hepatitis B If you have a higher risk for hepatitis B, you should be screened for this virus. Talk with your health care provider to find out if you are at risk for hepatitis B infection. Hepatitis C Testing is recommended for:  Everyone born from [birth year] through 1965.  Anyone  with known risk factors for hepatitis C. Sexually transmitted infections (STIs)  Get screened for STIs, including gonorrhea and chlamydia, if: ? You are sexually active and are younger than 50 years of age. ? You are older than 49 years of age and your health care provider tells you that you are at risk for this type of infection. ? Your sexual activity has changed since you were last screened, and you are at increased risk for chlamydia or gonorrhea. Ask your health care provider if you  are at risk.  Ask your health care provider about whether you are at high risk for HIV. Your health care provider may recommend a prescription medicine to help prevent HIV infection. If you choose to take medicine to prevent HIV, you should first get tested for HIV. You should then be tested every 3 months for as long as you are taking the medicine. Pregnancy  If you are about to stop having your period (premenopausal) and you may become pregnant, seek counseling before you get pregnant.  Take 400 to 800 micrograms (mcg) of folic acid every day if you become pregnant.  Ask for birth control (contraception) if you want to prevent pregnancy. Osteoporosis and menopause Osteoporosis is a disease in which the bones lose minerals and strength with aging. This can result in bone fractures. If you are 50 years old or older, or if you are at risk for osteoporosis and fractures, ask your health care provider if you should:  Be screened for bone loss.  Take a calcium or vitamin D supplement to lower your risk of fractures.  Be given hormone replacement therapy (HRT) to treat symptoms of menopause. Follow these instructions at home: Lifestyle  Do not use any products that contain nicotine or tobacco, such as cigarettes, e-cigarettes, and chewing tobacco. If you need help quitting, ask your health care provider.  Do not use street drugs.  Do not share needles.  Ask your health care provider for help if you need support or information about quitting drugs. Alcohol use  Do not drink alcohol if: ? Your health care provider tells you not to drink. ? You are pregnant, may be pregnant, or are planning to become pregnant.  If you drink alcohol: ? Limit how much you use to 0-1 drink a day. ? Limit intake if you are breastfeeding.  Be aware of how much alcohol is in your drink. In the [place] one drink equals one 12 oz bottle of beer (355 mL), one 5 oz glass of wine (148 mL), or one 1 oz glass of hard  liquor (44 mL). General instructions  Schedule regular health, dental, and eye exams.  Stay current with your vaccines.  Tell your health care provider if: ? You often feel depressed. ? You have ever been abused or do not feel safe at home. Summary  Adopting a healthy lifestyle and getting preventive care are important in promoting health and wellness.  Follow your health care provider's instructions about healthy diet, exercising, and getting tested or screened for diseases.  Follow your health care provider's instructions on monitoring your cholesterol and blood pressure. This information is not intended to replace advice given to you by your health care provider. Make sure you discuss any questions you have with your health care provider. Document Revised: 03/11/2018 Document Reviewed: 03/11/2018 Elsevier Patient Education  2020 [employer].

## 2019-10-27 LAB — HEPATIC FUNCTION PANEL
AG Ratio: 1.5 (calc) (ref 1.0–2.5)
ALT: 14 U/L (ref 6–29)
AST: 15 U/L (ref 10–35)
Albumin: 4.1 g/dL (ref 3.6–5.1)
Alkaline phosphatase (APISO): 103 U/L (ref 31–125)
Bilirubin, Direct: 0.1 mg/dL (ref 0.0–0.2)
Globulin: 2.7 g/dL (calc) (ref 1.9–3.7)
Indirect Bilirubin: 0.4 mg/dL (calc) (ref 0.2–1.2)
Total Bilirubin: 0.5 mg/dL (ref 0.2–1.2)
Total Protein: 6.8 g/dL (ref 6.1–8.1)

## 2019-10-27 LAB — T4, FREE: Free T4: 1.2 ng/dL (ref 0.8–1.8)

## 2019-10-27 LAB — CBC WITH DIFFERENTIAL/PLATELET
Absolute Monocytes: 527 cells/uL (ref 200–950)
Basophils Absolute: 34 cells/uL (ref 0–200)
Basophils Relative: 0.4 %
Eosinophils Absolute: 77 cells/uL (ref 15–500)
Eosinophils Relative: 0.9 %
HCT: 40.5 % (ref 35.0–45.0)
Hemoglobin: 13.3 g/dL (ref 11.7–15.5)
Lymphs Abs: 1573 cells/uL (ref 850–3900)
MCH: 29.2 pg (ref 27.0–33.0)
MCHC: 32.8 g/dL (ref 32.0–36.0)
MCV: 89 fL (ref 80.0–100.0)
MPV: 11.1 fL (ref 7.5–12.5)
Monocytes Relative: 6.2 %
Neutro Abs: 6290 cells/uL (ref 1500–7800)
Neutrophils Relative %: 74 %
Platelets: 303 10*3/uL (ref 140–400)
RBC: 4.55 10*6/uL (ref 3.80–5.10)
RDW: 12.5 % (ref 11.0–15.0)
Total Lymphocyte: 18.5 %
WBC: 8.5 10*3/uL (ref 3.8–10.8)

## 2019-10-27 LAB — HEPATITIS C ANTIBODY
Hepatitis C Ab: NONREACTIVE
SIGNAL TO CUT-OFF: 0.01 (ref <1.00)

## 2019-10-27 LAB — BASIC METABOLIC PANEL
BUN: 12 mg/dL (ref 7–25)
CO2: 27 mmol/L (ref 20–32)
Calcium: 9 mg/dL (ref 8.6–10.2)
Chloride: 106 mmol/L (ref 98–110)
Creat: 0.73 mg/dL (ref 0.50–1.10)
Glucose, Bld: 99 mg/dL (ref 65–99)
Potassium: 4.1 mmol/L (ref 3.5–5.3)
Sodium: 140 mmol/L (ref 135–146)

## 2019-10-27 LAB — HEMOGLOBIN A1C
Hgb A1c MFr Bld: 5.6 % of total Hgb (ref <5.7)
Mean Plasma Glucose: 114 (calc)
eAG (mmol/L): 6.3 (calc)

## 2019-10-27 LAB — LIPID PANEL
Cholesterol: 178 mg/dL (ref <200)
HDL: 56 mg/dL (ref >=50)
LDL Cholesterol (Calc): 104 mg/dL (calc) — ABNORMAL HIGH
Non-HDL Cholesterol (Calc): 122 mg/dL (calc) (ref <130)
Total CHOL/HDL Ratio: 3.2 (calc) (ref <5.0)
Triglycerides: 85 mg/dL (ref <150)

## 2019-10-27 LAB — TSH: TSH: 1.82 mIU/L

## 2019-10-27 NOTE — Progress Notes (Signed)
Blood sugar is normal  no diabetes    Cholesterol borderline but favorable  ratio  Rest of results are normal  Intensify lifestyle interventions.as we discussed   .

## 2019-12-16 ENCOUNTER — Encounter: Payer: Self-pay | Admitting: Internal Medicine

## 2020-08-15 ENCOUNTER — Encounter: Payer: Self-pay | Admitting: Gastroenterology

## 2020-09-01 ENCOUNTER — Other Ambulatory Visit: Payer: Self-pay

## 2020-09-01 ENCOUNTER — Ambulatory Visit (AMBULATORY_SURGERY_CENTER): Payer: Self-pay

## 2020-09-01 VITALS — Ht 62.5 in | Wt 164.0 lb

## 2020-09-01 DIAGNOSIS — Z1211 Encounter for screening for malignant neoplasm of colon: Secondary | ICD-10-CM

## 2020-09-01 DIAGNOSIS — Z800 Family history of malignant neoplasm of digestive organs: Secondary | ICD-10-CM

## 2020-09-01 MED ORDER — NA SULFATE-K SULFATE-MG SULF 17.5-3.13-1.6 GM/177ML PO SOLN: 1.0000 | Freq: Once | ORAL | 0 refills | Status: AC

## 2020-09-01 NOTE — Progress Notes (Signed)
No allergies to soy or egg Pt is not on blood thinners or diet pills Denies issues with sedation/intubation Denies atrial flutter/fib Denies constipation   Emmi instructions given to pt  Pt is aware of Covid safety and care partner requirements.  

## 2020-09-15 ENCOUNTER — Other Ambulatory Visit: Payer: Self-pay

## 2020-09-15 ENCOUNTER — Ambulatory Visit (AMBULATORY_SURGERY_CENTER): Payer: Commercial Managed Care - PPO | Admitting: Gastroenterology

## 2020-09-15 ENCOUNTER — Encounter: Payer: Self-pay | Admitting: Gastroenterology

## 2020-09-15 VITALS — BP 148/73 | HR 89 | Temp 97.1°F | Resp 16 | Ht 63.0 in | Wt 164.0 lb

## 2020-09-15 DIAGNOSIS — Z1211 Encounter for screening for malignant neoplasm of colon: Secondary | ICD-10-CM

## 2020-09-15 DIAGNOSIS — D122 Benign neoplasm of ascending colon: Secondary | ICD-10-CM

## 2020-09-15 MED ORDER — SODIUM CHLORIDE 0.9 % IV SOLN: 500.0000 mL | INTRAVENOUS | Status: AC

## 2020-09-15 NOTE — Progress Notes (Signed)
Pt's states no medical or surgical changes since previsit or office visit. 

## 2020-09-15 NOTE — Progress Notes (Signed)
pt tolerated well. VSS. awake and to recovery. Report given to RN.  

## 2020-09-15 NOTE — Patient Instructions (Signed)
Resume previous diet and medications. Awaiting pathology results. Repeat colonoscopy date to be determined based on pathology.  YOU HAD AN ENDOSCOPIC PROCEDURE TODAY AT [facility]:   Refer to the procedure report that was given to you for any specific questions about what was found during the examination.  If the procedure report does not answer your questions, please call your gastroenterologist to clarify.  If you requested that your care partner not be given the details of your procedure findings, then the procedure report has been included in a sealed envelope for you to review at your convenience later.  YOU SHOULD EXPECT: Some feelings of bloating in the abdomen. Passage of more gas than usual.  Walking can help get rid of the air that was put into your GI tract during the procedure and reduce the bloating. If you had a lower endoscopy (such as a colonoscopy or flexible sigmoidoscopy) you may notice spotting of blood in your stool or on the toilet paper. If you underwent a bowel prep for your procedure, you may not have a normal bowel movement for a few days.  Please Note:  You might notice some irritation and congestion in your nose or some drainage.  This is from the oxygen used during your procedure.  There is no need for concern and it should clear up in a day or so.  SYMPTOMS TO REPORT IMMEDIATELY:  Following lower endoscopy (colonoscopy or flexible sigmoidoscopy):  Excessive amounts of blood in the stool  Significant tenderness or worsening of abdominal pains  Swelling of the abdomen that is new, acute  Fever of 100F or higher   For urgent or emergent issues, a gastroenterologist can be reached at any hour by calling [phone]. Do not use MyChart messaging for urgent concerns.    DIET:  We do recommend a small meal at first, but then you may proceed to your regular diet.  Drink plenty of fluids but you should avoid alcoholic beverages for 24  hours.  ACTIVITY:  You should plan to take it easy for the rest of today and you should NOT DRIVE or use heavy machinery until tomorrow (because of the sedation medicines used during the test).    FOLLOW UP: Our staff will call the number listed on your records 48-72 hours following your procedure to check on you and address any questions or concerns that you may have regarding the information given to you following your procedure. If we do not reach you, we will leave a message.  We will attempt to reach you two times.  During this call, we will ask if you have developed any symptoms of COVID 19. If you develop any symptoms (ie: fever, flu-like symptoms, shortness of breath, cough etc.) before then, please call [phone].  If you test positive for Covid 19 in the 2 weeks post procedure, please call and report this information to [place].    If any biopsies were taken you will be contacted by phone or by letter within the next 1-3 weeks.  Please call us at [phone] if you have not heard about the biopsies in 3 weeks.    SIGNATURES/CONFIDENTIALITY: You and/or your care partner have signed paperwork which will be entered into your electronic medical record.  These signatures attest to the fact that that the information above on your After Visit Summary has been reviewed and is understood.  Full responsibility of the confidentiality of this discharge information lies with you and/or your care-partner.

## 2020-09-15 NOTE — Progress Notes (Signed)
Called to room to assist during endoscopic procedure.  Patient ID and intended procedure confirmed with present staff. Received instructions for my participation in the procedure from the performing physician.  

## 2020-09-15 NOTE — Op Note (Signed)
[name] Procedure Date: 09/15/2020 8:33 AM MRN: [id number] Endoscopist: [place]. Lauren Owens , MD Age: 50 Referring MD:  Date of Birth: Jun 09, 1970 Gender: Female Account #: [id number] Procedure:                Colonoscopy Indications:              Screening for colorectal malignant neoplasm, This                            is the patient's first colonoscopy Medicines:                Monitored Anesthesia Care Procedure:                Pre-Anesthesia Assessment:                           - Prior to the procedure, a History and Physical                            was performed, and patient medications and                            allergies were reviewed. The patient's tolerance of                            previous anesthesia was also reviewed. The risks                            and benefits of the procedure and the sedation                            options and risks were discussed with the patient.                            All questions were answered, and informed consent                            was obtained. Prior Anticoagulants: The patient has                            taken no previous anticoagulant or antiplatelet                            agents. ASA Grade Assessment: II - A patient with                            mild systemic disease. After reviewing the risks                            and benefits, the patient was deemed in                            satisfactory condition to undergo the procedure.  After obtaining informed consent, the colonoscope                            was passed under direct vision. Throughout the                            procedure, the patient's blood pressure, pulse, and                            oxygen saturations were monitored continuously. The                            Olympus CF-[id number]) Colonoscope was                            introduced through the anus  and advanced to the the                            cecum, identified by appendiceal orifice and                            ileocecal valve. The colonoscopy was performed                            without difficulty. The patient tolerated the                            procedure well. The quality of the bowel                            preparation was excellent. The ileocecal valve,                            appendiceal orifice, and rectum were photographed. Scope In: 8:47:20 AM Scope Out: 8:58:45 AM Scope Withdrawal Time: 0 hours 8 minutes 39 seconds  Total Procedure Duration: 0 hours 11 minutes 25 seconds  Findings:                 The perianal and digital rectal examinations were                            normal.                           A 10 mm polyp was found in the ascending colon. The                            polyp was semi-sessile with a mucus cap (probable                            SSP by WL and NBI appearance). The polyp was                            removed with a piecemeal technique using a cold  snare. Resection and retrieval were complete.                           The exam was otherwise without abnormality on                            direct and retroflexion views. Complications:            No immediate complications. Estimated Blood Loss:     Estimated blood loss was minimal. Impression:               - One 10 mm polyp in the ascending colon, removed                            piecemeal using a cold snare. Resected and                            retrieved.                           - The examination was otherwise normal on direct                            and retroflexion views. Recommendation:           - Patient has a contact number available for                            emergencies. The signs and symptoms of potential                            delayed complications were discussed with the                            patient. Return  to normal activities tomorrow.                            Written discharge instructions were provided to the                            patient.                           - Resume previous diet.                           - Continue present medications.                           - Await pathology results.                           - Repeat colonoscopy is recommended for                            surveillance. The colonoscopy date will be  determined after pathology results from today's                            exam become available for review. Lauren Larue L. Lauren Carrow, MD 09/15/2020 9:02:49 AM This report has been signed electronically.

## 2020-09-19 ENCOUNTER — Telehealth: Payer: Self-pay

## 2020-09-19 NOTE — Telephone Encounter (Signed)
First post procedure follow up call, no answer 

## 2020-09-19 NOTE — Telephone Encounter (Signed)
  Follow up Call-  Call back number 09/15/2020  Post procedure Call Back phone  # [phone]  Permission to leave phone message Yes  Some recent data might be hidden     Patient questions:  Do you have a fever, pain , or abdominal swelling? No. Pain Score  0 *  Have you tolerated food without any problems? Yes.    Have you been able to return to your normal activities? Yes.    Do you have any questions about your discharge instructions: Diet   No. Medications  No. Follow up visit  No.  Do you have questions or concerns about your Care? No.  Actions: * If pain score is 4 or above: No action needed, pain <4. Have you developed a fever since your procedure? no  2.   Have you had an respiratory symptoms (SOB or cough) since your procedure? no  3.   Have you tested positive for COVID 19 since your procedure no  4.   Have you had any family members/close contacts diagnosed with the COVID 19 since your procedure?  no   If yes to any of these questions please route to Joylene John, RN and Joella Prince, RN

## 2020-09-21 ENCOUNTER — Encounter: Payer: Self-pay | Admitting: Gastroenterology

## 2020-10-31 NOTE — Progress Notes (Deleted)
No chief complaint on file.   HPI: Patient  Lauren Owens  50 y.o. comes in today for Preventive Health Care visit   Health Maintenance  Topic Date Due   COVID-19 Vaccine (1) Never done   HIV Screening  Never done   PAP SMEAR-Modifier  07/31/2017   MAMMOGRAM  Never done   Zoster Vaccines- Shingrix (1 of 2) Never done   INFLUENZA VACCINE  10/30/2020   COLONOSCOPY (Pts 45-74yr Insurance coverage will need to be confirmed)  09/16/2023   TETANUS/TDAP  02/01/2024   Hepatitis C Screening  Completed   Pneumococcal Vaccine [id number] old  Aged Out   HPV VACCINES  Aged Out   Health Maintenance Review LIFESTYLE:  Exercise:   Tobacco/ETS: Alcohol:  Sugar beverages: Sleep: Drug use: no HH of  Work:    ROS:  GEN/ HEENT: No fever, significant weight changes sweats headaches vision problems hearing changes, CV/ PULM; No chest pain shortness of breath cough, syncope,edema  change in exercise tolerance. GI /GU: No adominal pain, vomiting, change in bowel habits. No blood in the stool. No significant GU symptoms. SKIN/HEME: ,no acute skin rashes suspicious lesions or bleeding. No lymphadenopathy, nodules, masses.  NEURO/ PSYCH:  No neurologic signs such as weakness numbness. No depression anxiety. IMM/ Allergy: No unusual infections.  Allergy .   REST of 12 system review negative except as per HPI   Past Medical History:  Diagnosis Date   Blood glucose elevated    borderline   Migraine    Physiological tremor    exagerated has used b blocker in past    Past Surgical History:  Procedure Laterality Date   CESAREAN SECTION     CHOLECYSTECTOMY      Family History  Problem Relation Age of Onset   Hypertension Other    Gallbladder disease Mother    Heart failure Mother    Colon cancer Mother 663  Lung cancer Other    Coronary artery disease Father        in his 546'scabg x 5   Cancer Father    Colon polyps Neg Hx    Esophageal cancer Neg Hx    Rectal cancer Neg  Hx    Stomach cancer Neg Hx     Social History   Socioeconomic History   Marital status: Married    Spouse name: Not on file   Number of children: Not on file   Years of education: Not on file   Highest education level: Not on file  Occupational History   Not on file  Tobacco Use   Smoking status: Never   Smokeless tobacco: Never  Vaping Use   Vaping Use: Never used  Substance and Sexual Activity   Alcohol use: No   Drug use: No   Sexual activity: Not on file  Other Topics Concern   Not on file  Social History Narrative   Married   Second marriage   HH of 3 1 dog and 1 cat  fam animals    Works aNeurosurgeon40 + hours.    [employer]   [place] and blended family   2 dog and GP   Drinks milk and lots of caffeine   Not regular exercise   Sleep 5 hours   Social Determinants of Health   Financial Resource Strain: Not on file  Food Insecurity: Not on file  Transportation Needs: Not on file  Physical Activity: Not on file  Stress: Not on file  Social Connections: Not on file    Outpatient Medications Prior to Visit  Medication Sig Dispense Refill   Cetirizine HCl (ZYRTEC PO) Take by mouth.     Multiple Vitamins-Minerals (CENTRUM WOMEN PO) Take 1 tablet by mouth daily.     Facility-Administered Medications Prior to Visit  Medication Dose Route Frequency Provider Last Rate Last Admin   0.9 %  sodium chloride infusion  500 mL Intravenous Continuous Danis, Estill Cotta III, MD         EXAM:  There were no vitals taken for this visit.  There is no height or weight on file to calculate BMI. Wt Readings from Last 3 Encounters:  09/15/20 164 lb (74.4 kg)  09/01/20 164 lb (74.4 kg)  10/26/19 164 lb 3.2 oz (74.5 kg)    Physical Exam: Vital signs reviewed [id number] is a well-developed well-nourished alert cooperative    who appearsr stated age in no acute distress.  HEENT: normocephalic atraumatic , Eyes: PERRL EOM's  full, conjunctiva clear, Nares: paten,t no deformity discharge or tenderness., Ears: no deformity EAC's clear TMs with normal landmarks. Mouth: clear OP, masked  NECK: supple without masses, thyromegaly or bruits. CHEST/PULM:  Clear to auscultation and percussion breath sounds equal no wheeze , rales or rhonchi. No chest wall deformities or tenderness. Breast: normal by inspection . No dimpling, discharge, masses, tenderness or discharge . CV: PMI is nondisplaced, S1 S2 no gallops, murmurs, rubs. Peripheral pulses are full without delay.No JVD .  ABDOMEN: Bowel sounds normal nontender  No guard or rebound, no hepato splenomegal no CVA tenderness.  No hernia. Extremtities:  No clubbing cyanosis or edema, no acute joint swelling or redness no focal atrophy NEURO:  Oriented x3, cranial nerves 3-12 appear to be intact, no obvious focal weakness,gait within normal limits no abnormal reflexes or asymmetrical SKIN: No acute rashes normal turgor, color, no bruising or petechiae. PSYCH: Oriented, good eye contact, no obvious depression anxiety, cognition and judgment appear normal. LN: no cervical axillary inguinal adenopathy  Lab Results  Component Value Date   WBC 8.5 10/26/2019   HGB 13.3 10/26/2019   HCT 40.5 10/26/2019   PLT 303 10/26/2019   GLUCOSE 99 10/26/2019   CHOL 178 10/26/2019   TRIG 85 10/26/2019   HDL 56 10/26/2019   LDLCALC 104 (H) 10/26/2019   ALT 14 10/26/2019   AST 15 10/26/2019   NA 140 10/26/2019   K 4.1 10/26/2019   CL 106 10/26/2019   CREATININE 0.73 10/26/2019   BUN 12 10/26/2019   CO2 27 10/26/2019   TSH 1.82 10/26/2019   HGBA1C 5.6 10/26/2019    BP Readings from Last 3 Encounters:  09/15/20 (!) 148/73  10/26/19 126/82  12/02/18 136/82    Lab results reviewed with patient   ASSESSMENT AND PLAN:  Discussed the following assessment and plan:  No diagnosis found. No follow-ups on file.  Patient Care Team: Shiori Adcox, Standley Brooking, MD as PCP - General Dian Queen, MD as Attending Physician (Obstetrics and Gynecology) There are no Patient Instructions on file for this visit.  Standley Brooking. Cam Dauphin M.D.

## 2020-11-01 ENCOUNTER — Encounter: Payer: Commercial Managed Care - PPO | Admitting: Internal Medicine

## 2020-11-01 DIAGNOSIS — Z0000 Encounter for general adult medical examination without abnormal findings: Secondary | ICD-10-CM

## 2021-08-22 ENCOUNTER — Encounter: Payer: Self-pay | Admitting: Internal Medicine

## 2021-08-22 ENCOUNTER — Ambulatory Visit (INDEPENDENT_AMBULATORY_CARE_PROVIDER_SITE_OTHER): Payer: 59 | Admitting: Internal Medicine

## 2021-08-22 VITALS — BP 122/78 | HR 82 | Temp 98.1°F | Ht 63.0 in | Wt 165.0 lb

## 2021-08-22 DIAGNOSIS — Z79899 Other long term (current) drug therapy: Secondary | ICD-10-CM | POA: Diagnosis not present

## 2021-08-22 DIAGNOSIS — R739 Hyperglycemia, unspecified: Secondary | ICD-10-CM | POA: Diagnosis not present

## 2021-08-22 DIAGNOSIS — Z0000 Encounter for general adult medical examination without abnormal findings: Secondary | ICD-10-CM | POA: Diagnosis not present

## 2021-08-22 DIAGNOSIS — Z833 Family history of diabetes mellitus: Secondary | ICD-10-CM

## 2021-08-22 LAB — HEPATIC FUNCTION PANEL
ALT: 22 U/L (ref 0–35)
AST: 21 U/L (ref 0–37)
Albumin: 4.2 g/dL (ref 3.5–5.2)
Alkaline Phosphatase: 116 U/L (ref 39–117)
Bilirubin, Direct: 0.1 mg/dL (ref 0.0–0.3)
Total Bilirubin: 0.6 mg/dL (ref 0.2–1.2)
Total Protein: 7.4 g/dL (ref 6.0–8.3)

## 2021-08-22 LAB — CBC WITH DIFFERENTIAL/PLATELET
Basophils Absolute: 0 10*3/uL (ref 0.0–0.1)
Basophils Relative: 0.3 % (ref 0.0–3.0)
Eosinophils Absolute: 0 10*3/uL (ref 0.0–0.7)
Eosinophils Relative: 0.5 % (ref 0.0–5.0)
HCT: 40.9 % (ref 36.0–46.0)
Hemoglobin: 13.6 g/dL (ref 12.0–15.0)
Lymphocytes Relative: 21.9 % (ref 12.0–46.0)
Lymphs Abs: 1.8 10*3/uL (ref 0.7–4.0)
MCHC: 33.3 g/dL (ref 30.0–36.0)
MCV: 86.3 fl (ref 78.0–100.0)
Monocytes Absolute: 0.6 10*3/uL (ref 0.1–1.0)
Monocytes Relative: 7.5 % (ref 3.0–12.0)
Neutro Abs: 5.9 10*3/uL (ref 1.4–7.7)
Neutrophils Relative %: 69.8 % (ref 43.0–77.0)
Platelets: 252 10*3/uL (ref 150.0–400.0)
RBC: 4.73 Mil/uL (ref 3.87–5.11)
RDW: 13.8 % (ref 11.5–15.5)
WBC: 8.4 10*3/uL (ref 4.0–10.5)

## 2021-08-22 LAB — BASIC METABOLIC PANEL
BUN: 12 mg/dL (ref 6–23)
CO2: 29 mEq/L (ref 19–32)
Calcium: 9.8 mg/dL (ref 8.4–10.5)
Chloride: 104 mEq/L (ref 96–112)
Creatinine, Ser: 0.79 mg/dL (ref 0.40–1.20)
GFR: 86.68 mL/min (ref >60.00)
Glucose, Bld: 97 mg/dL (ref 70–99)
Potassium: 4.3 mEq/L (ref 3.5–5.1)
Sodium: 140 mEq/L (ref 135–145)

## 2021-08-22 LAB — HEMOGLOBIN A1C: Hgb A1c MFr Bld: 5.9 % (ref 4.6–6.5)

## 2021-08-22 LAB — LIPID PANEL
Cholesterol: 181 mg/dL (ref 0–200)
HDL: 57.7 mg/dL (ref >39.00)
LDL Cholesterol: 104 mg/dL — ABNORMAL HIGH (ref 0–99)
NonHDL: 122.88
Total CHOL/HDL Ratio: 3
Triglycerides: 95 mg/dL (ref 0.0–149.0)
VLDL: 19 mg/dL (ref 0.0–40.0)

## 2021-08-22 LAB — TSH: TSH: 1.73 u[IU]/mL (ref 0.35–5.50)

## 2021-08-22 NOTE — Progress Notes (Signed)
Chief Complaint  Patient presents with   Annual Exam    Fasting     HPI: Patient  Lauren Owens  51 y.o. comes in today for Preventive Health Care visit  Is in menopause  no major change in health working on the weight gain. And habits   Health Maintenance  Topic Date Due   MAMMOGRAM  02/22/2022 (Originally 05/02/2020)   PAP SMEAR-Modifier  02/22/2022 (Originally 07/31/2017)   COVID-19 Vaccine (1) 02/22/2022 (Originally 10/31/1970)   HIV Screening  02/22/2022 (Originally 05/02/1985)   Zoster Vaccines- Shingrix (1 of 2) 02/22/2022 (Originally 05/02/2020)   INFLUENZA VACCINE  10/30/2021   COLONOSCOPY (Pts 45-66yr Insurance coverage will need to be confirmed)  09/16/2023   TETANUS/TDAP  02/01/2024   Hepatitis C Screening  Completed   HPV VACCINES  Aged Out   Health Maintenance Review LIFESTYLE:  Exercise:  not reg  since busy new job.  Tobacco/ETS: n Alcohol:  n Sugar beverages: Sleep: 5 if lucky  Drug use: no HH of   3 2 dog 1 cat  Work: new job   463- [phone] Multivit.  Gyne check greywal 2021 and mammogram  Had colon 22 -10 mm polyp to repeat in 2 years   ROS:  REST of 12 system review negative except as per HPI   Past Medical History:  Diagnosis Date   Blood glucose elevated    borderline   Migraine    Physiological tremor    exagerated has used b blocker in past    Past Surgical History:  Procedure Laterality Date   CESAREAN SECTION     CHOLECYSTECTOMY      Family History  Problem Relation Age of Onset   Hypertension Other    Gallbladder disease Mother    Heart failure Mother    Colon cancer Mother 646  Lung cancer Other    Coronary artery disease Father        in his 533'scabg x 5   Cancer Father    Colon polyps Neg Hx    Esophageal cancer Neg Hx    Rectal cancer Neg Hx    Stomach cancer Neg Hx     Social History   Socioeconomic History   Marital status: Married    Spouse name: Not on file   Number of children: Not on file   Years of  education: Not on file   Highest education level: Not on file  Occupational History   Not on file  Tobacco Use   Smoking status: Never   Smokeless tobacco: Never  Vaping Use   Vaping Use: Never used  Substance and Sexual Activity   Alcohol use: No   Drug use: No   Sexual activity: Not on file  Other Topics Concern   Not on file  Social History Narrative   Married   Second marriage   HH of 3 1 dog and 1 cat  fam animals    Works aNeurosurgeon40 + hours.    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     Multiple Vitamins-Minerals (CENTRUM WOMEN PO) Take 1 tablet by mouth daily.     Facility-Administered Medications Prior to Visit  Medication Dose Route Frequency Provider Last Rate Last Admin   0.9 %  sodium chloride infusion  500 mL Intravenous Continuous Danis, Estill Cotta III, MD         EXAM:  BP 122/78 (BP Location: Left Arm, Patient Position: Sitting, Cuff Size: Normal)   Pulse 82   Temp 98.1 F (36.7 C) (Oral)   Ht '5\' 3"'$  (1.6 m)   Wt 165 lb (74.8 kg)   SpO2 98%   BMI 29.23 kg/m   Body mass index is 29.23 kg/m. Wt Readings from Last 3 Encounters:  08/22/21 165 lb (74.8 kg)  09/15/20 164 lb (74.4 kg)  09/01/20 164 lb (74.4 kg)    Physical Exam: Vital signs reviewed [id number] is a well-developed well-nourished alert cooperative    who appearsr stated age in no acute distress.  HEENT: normocephalic atraumatic , Eyes: PERRL EOM's full, conjunctiva clear, Nares: paten,t no deformity  discharge or tenderness., Ears: no deformity EAC's clear TMs with normal landmarks.  NECK: supple without masses, thyromegaly or bruits. CHEST/PULM:  Clear to auscultation and percussion breath sounds equal no wheeze , rales or rhonchi. No chest wall deformities or tenderness. Breast: normal by inspection . No dimpling, discharge, masses, tenderness or discharge . CV: PMI is nondisplaced, S1 S2 no gallops, murmurs, rubs. Peripheral pulses are full without delay.No JVD .  ABDOMEN: Bowel sounds normal nontender  No guard or rebound, no hepato splenomegal no CVA tenderness.   Extremtities:  No clubbing cyanosis or edema, no acute joint swelling or redness no focal atrophy NEURO:  Oriented x3, cranial nerves 3-12 appear to be intact, no obvious focal weakness,gait within normal limits no abnormal reflexes or asymmetrical SKIN: No acute rashes normal turgor, color, no bruising or petechiae. PSYCH: Oriented, good eye contact, no obvious depression anxiety, cognition and judgment appear normal. LN: no cervical axillary inguinal adenopathy  Lab Results  Component Value Date   WBC 8.5 10/26/2019   HGB 13.3 10/26/2019   HCT 40.5 10/26/2019   PLT 303 10/26/2019   GLUCOSE 99 10/26/2019   CHOL 178 10/26/2019   TRIG 85 10/26/2019   HDL 56 10/26/2019   LDLCALC 104 (H) 10/26/2019   ALT 14 10/26/2019   AST 15 10/26/2019   NA 140 10/26/2019   K 4.1 10/26/2019   CL 106 10/26/2019   CREATININE 0.73 10/26/2019   BUN 12 10/26/2019   CO2 27 10/26/2019   TSH 1.82 10/26/2019   HGBA1C 5.6 10/26/2019    BP Readings from Last 3 Encounters:  08/22/21 122/78  09/15/20 (!) 148/73  10/26/19 126/82    Labplan fasting eviewed with patient   ASSESSMENT AND PLAN:  Discussed the following assessment and plan:    ICD-10-CM   1. Visit for preventive health examination  J57.01 Basic metabolic panel    CBC with Differential/Platelet    Hemoglobin A1c    Hepatic function panel    Lipid panel    TSH     TSH    Lipid panel    Hepatic function panel    Hemoglobin A1c    CBC with Differential/Platelet    Basic metabolic panel    2. Hyperglycemia  X79.3 Basic metabolic panel  CBC with Differential/Platelet    Hemoglobin A1c    Hepatic function panel    Lipid panel    TSH    TSH    Lipid panel    Hepatic function panel    Hemoglobin A1c    CBC with Differential/Platelet    Basic metabolic panel    3. Family history of diabetes mellitus  D40.8 Basic metabolic panel    CBC with Differential/Platelet    Hemoglobin A1c    Hepatic function panel    Lipid panel    TSH    TSH    Lipid panel    Hepatic function panel    Hemoglobin A1c    CBC with Differential/Platelet    Basic metabolic panel    4. Medication management  X44.818 Basic metabolic panel    CBC with Differential/Platelet    Hemoglobin A1c    Hepatic function panel    Lipid panel    TSH    TSH    Lipid panel    Hepatic function panel    Hemoglobin A1c    CBC with Differential/Platelet    Basic metabolic panel    Bmi 29 attending   life style sleep activity etc   Return in about 1 year (around 08/23/2022) for depending on results.  Patient Care Team: Sayf Kerner, Standley Brooking, MD as PCP - General Dian Queen, MD as Attending Physician (Obstetrics and Gynecology) Patient Instructions  Good to see you today . Activity steps  consider weight watchers or similar to track   . Optimize sleep as  possible.  Lab pending      Standley Brooking. Oreste Majeed M.D.

## 2021-08-22 NOTE — Patient Instructions (Signed)
Good to see you today . Activity steps  consider weight watchers or similar to track   . Optimize sleep as  possible.  Lab pending

## 2021-08-22 NOTE — Progress Notes (Signed)
Blood results show no diabetes lipid panel is mostly favorable Thyroid chemistries normal hemoglobin A1c in the borderline elevated range. Attention to healthy lifestyle as we discussed can repeat labs at yearly.

## 2022-08-27 NOTE — Progress Notes (Unsigned)
No chief complaint on file.   HPI: Patient  Lauren Owens  52 y.o. comes in today for Preventive Health Care visit   Health Maintenance  Topic Date Due   COVID-19 Vaccine (1) Never done   HIV Screening  Never done   Zoster Vaccines- Shingrix (1 of 2) Never done   PAP SMEAR-Modifier  07/31/2017   MAMMOGRAM  Never done   INFLUENZA VACCINE  10/31/2022   Colonoscopy  09/16/2023   DTaP/Tdap/Td (3 - Td or Tdap) 02/01/2024   Hepatitis C Screening  Completed   HPV VACCINES  Aged Out   Health Maintenance Review LIFESTYLE:  Exercise:   Tobacco/ETS: Alcohol:  Sugar beverages: Sleep: Drug use: no HH of  Work:    ROS:  GEN/ HEENT: No fever, significant weight changes sweats headaches vision problems hearing changes, CV/ PULM; No chest pain shortness of breath cough, syncope,edema  change in exercise tolerance. GI /GU: No adominal pain, vomiting, change in bowel habits. No blood in the stool. No significant GU symptoms. SKIN/HEME: ,no acute skin rashes suspicious lesions or bleeding. No lymphadenopathy, nodules, masses.  NEURO/ PSYCH:  No neurologic signs such as weakness numbness. No depression anxiety. IMM/ Allergy: No unusual infections.  Allergy .   REST of 12 system review negative except as per HPI   Past Medical History:  Diagnosis Date   Blood glucose elevated    borderline   Migraine    Physiological tremor    exagerated has used b blocker in past    Past Surgical History:  Procedure Laterality Date   CESAREAN SECTION     CHOLECYSTECTOMY      Family History  Problem Relation Age of Onset   Hypertension Other    Gallbladder disease Mother    Heart failure Mother    Colon cancer Mother 65   Lung cancer Other    Coronary artery disease Father        in his 94's cabg x 5   Cancer Father    Colon polyps Neg Hx    Esophageal cancer Neg Hx    Rectal cancer Neg Hx    Stomach cancer Neg Hx     Social History   Socioeconomic History   Marital  status: Married    Spouse name: Not on file   Number of children: Not on file   Years of education: Not on file   Highest education level: Not on file  Occupational History   Not on file  Tobacco Use   Smoking status: Never   Smokeless tobacco: Never  Vaping Use   Vaping Use: Never used  Substance and Sexual Activity   Alcohol use: No   Drug use: No   Sexual activity: Not on file  Other Topics Concern   Not on file  Social History Narrative   Married   Second marriage   HH of 3 1 dog and 1 cat  fam animals    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    [employer]   Commuting from [place].    Has a farm and blended family   2 dog and GP   Drinks milk and lots of caffeine   Not regular exercise   Sleep 5 hours   Social Determinants of Health   Financial Resource Strain: Not on file  Food Insecurity: Not on file  Transportation Needs: Not on file  Physical Activity: Not on file  Stress: Not on file  Social Connections: Not on  file    Outpatient Medications Prior to Visit  Medication Sig Dispense Refill   Cetirizine HCl (ZYRTEC PO) Take by mouth.     Multiple Vitamins-Minerals (CENTRUM WOMEN PO) Take 1 tablet by mouth daily.     Facility-Administered Medications Prior to Visit  Medication Dose Route Frequency Provider Last Rate Last Admin   0.9 %  sodium chloride infusion  500 mL Intravenous Continuous Danis, Starr Lake III, MD         EXAM:  There were no vitals taken for this visit.  There is no height or weight on file to calculate BMI. Wt Readings from Last 3 Encounters:  08/22/21 165 lb (74.8 kg)  09/15/20 164 lb (74.4 kg)  09/01/20 164 lb (74.4 kg)    Physical Exam: Vital signs reviewed WUJ:WJXB is a well-developed well-nourished alert cooperative    who appearsr stated age in no acute distress.  HEENT: normocephalic atraumatic , Eyes: PERRL EOM's full, conjunctiva clear, Nares: paten,t no deformity discharge or tenderness., Ears: no deformity  EAC's clear TMs with normal landmarks. Mouth: clear OP, no lesions, edema.  Moist mucous membranes. Dentition in adequate repair. NECK: supple without masses, thyromegaly or bruits. CHEST/PULM:  Clear to auscultation and percussion breath sounds equal no wheeze , rales or rhonchi. No chest wall deformities or tenderness. Breast: normal by inspection . No dimpling, discharge, masses, tenderness or discharge . CV: PMI is nondisplaced, S1 S2 no gallops, murmurs, rubs. Peripheral pulses are full without delay.No JVD .  ABDOMEN: Bowel sounds normal nontender  No guard or rebound, no hepato splenomegal no CVA tenderness.  No  Extremtities:  No clubbing cyanosis or edema, no acute joint swelling or redness no focal atrophy NEURO:  Oriented x3, cranial nerves 3-12 appear to be intact, no obvious focal weakness,gait within normal limits no abnormal reflexes or asymmetrical SKIN: No acute rashes normal turgor, color, no bruising or petechiae. PSYCH: Oriented, good eye contact, no obvious depression anxiety, cognition and judgment appear normal. LN: no cervical axillary inguinal adenopathy  Lab Results  Component Value Date   WBC 8.4 08/22/2021   HGB 13.6 08/22/2021   HCT 40.9 08/22/2021   PLT 252.0 08/22/2021   GLUCOSE 97 08/22/2021   CHOL 181 08/22/2021   TRIG 95.0 08/22/2021   HDL 57.70 08/22/2021   LDLCALC 104 (H) 08/22/2021   ALT 22 08/22/2021   AST 21 08/22/2021   NA 140 08/22/2021   K 4.3 08/22/2021   CL 104 08/22/2021   CREATININE 0.79 08/22/2021   BUN 12 08/22/2021   CO2 29 08/22/2021   TSH 1.73 08/22/2021   HGBA1C 5.9 08/22/2021    BP Readings from Last 3 Encounters:  08/22/21 122/78  09/15/20 (!) 148/73  10/26/19 126/82    Labplanreviewed with patient   ASSESSMENT AND PLAN:  Discussed the following assessment and plan:  No diagnosis found. No follow-ups on file.  Patient Care Team: Larry Alcock, Neta Mends, MD as PCP - General Marcelle Overlie, MD as Attending Physician  (Obstetrics and Gynecology) There are no Patient Instructions on file for this visit.  Neta Mends. [name] M.D.

## 2022-08-28 ENCOUNTER — Ambulatory Visit (INDEPENDENT_AMBULATORY_CARE_PROVIDER_SITE_OTHER): Payer: 59 | Admitting: Internal Medicine

## 2022-08-28 ENCOUNTER — Encounter: Payer: Self-pay | Admitting: Internal Medicine

## 2022-08-28 VITALS — BP 138/94 | HR 91 | Temp 97.5°F | Ht 62.0 in | Wt 169.2 lb

## 2022-08-28 DIAGNOSIS — R739 Hyperglycemia, unspecified: Secondary | ICD-10-CM | POA: Diagnosis not present

## 2022-08-28 DIAGNOSIS — Z833 Family history of diabetes mellitus: Secondary | ICD-10-CM

## 2022-08-28 DIAGNOSIS — R030 Elevated blood-pressure reading, without diagnosis of hypertension: Secondary | ICD-10-CM | POA: Diagnosis not present

## 2022-08-28 DIAGNOSIS — Z0001 Encounter for general adult medical examination with abnormal findings: Secondary | ICD-10-CM | POA: Diagnosis not present

## 2022-08-28 DIAGNOSIS — H6192 Disorder of left external ear, unspecified: Secondary | ICD-10-CM | POA: Diagnosis not present

## 2022-08-28 DIAGNOSIS — Z23 Encounter for immunization: Secondary | ICD-10-CM

## 2022-08-28 DIAGNOSIS — Z0000 Encounter for general adult medical examination without abnormal findings: Secondary | ICD-10-CM

## 2022-08-28 LAB — CBC WITH DIFFERENTIAL/PLATELET
Basophils Absolute: 0 10*3/uL (ref 0.0–0.1)
Basophils Relative: 0.6 % (ref 0.0–3.0)
Eosinophils Absolute: 0.1 10*3/uL (ref 0.0–0.7)
Eosinophils Relative: 0.7 % (ref 0.0–5.0)
HCT: 44.5 % (ref 36.0–46.0)
Hemoglobin: 14.3 g/dL (ref 12.0–15.0)
Lymphocytes Relative: 23 % (ref 12.0–46.0)
Lymphs Abs: 1.7 10*3/uL (ref 0.7–4.0)
MCHC: 32.1 g/dL (ref 30.0–36.0)
MCV: 87.1 fl (ref 78.0–100.0)
Monocytes Absolute: 0.6 10*3/uL (ref 0.1–1.0)
Monocytes Relative: 8 % (ref 3.0–12.0)
Neutro Abs: 5.2 10*3/uL (ref 1.4–7.7)
Neutrophils Relative %: 67.7 % (ref 43.0–77.0)
Platelets: 289 10*3/uL (ref 150.0–400.0)
RBC: 5.11 Mil/uL (ref 3.87–5.11)
RDW: 14 % (ref 11.5–15.5)
WBC: 7.6 10*3/uL (ref 4.0–10.5)

## 2022-08-28 LAB — LIPID PANEL
Cholesterol: 178 mg/dL (ref 0–200)
HDL: 62.6 mg/dL (ref >39.00)
LDL Cholesterol: 103 mg/dL — ABNORMAL HIGH (ref 0–99)
NonHDL: 114.92
Total CHOL/HDL Ratio: 3
Triglycerides: 59 mg/dL (ref 0.0–149.0)
VLDL: 11.8 mg/dL (ref 0.0–40.0)

## 2022-08-28 LAB — COMPREHENSIVE METABOLIC PANEL
ALT: 26 U/L (ref 0–35)
AST: 26 U/L (ref 0–37)
Albumin: 4.3 g/dL (ref 3.5–5.2)
Alkaline Phosphatase: 128 U/L — ABNORMAL HIGH (ref 39–117)
BUN: 14 mg/dL (ref 6–23)
CO2: 28 mEq/L (ref 19–32)
Calcium: 9.9 mg/dL (ref 8.4–10.5)
Chloride: 104 mEq/L (ref 96–112)
Creatinine, Ser: 0.79 mg/dL (ref 0.40–1.20)
GFR: 86.06 mL/min (ref >60.00)
Glucose, Bld: 94 mg/dL (ref 70–99)
Potassium: 3.9 mEq/L (ref 3.5–5.1)
Sodium: 141 mEq/L (ref 135–145)
Total Bilirubin: 0.5 mg/dL (ref 0.2–1.2)
Total Protein: 8 g/dL (ref 6.0–8.3)

## 2022-08-28 LAB — HEMOGLOBIN A1C: Hgb A1c MFr Bld: 6 % (ref 4.6–6.5)

## 2022-08-28 LAB — TSH: TSH: 1.58 u[IU]/mL (ref 0.35–5.50)

## 2022-08-28 NOTE — Patient Instructions (Addendum)
Good to see you today  Your BP reading is high  Goal bp 130/80 range and below  at least below 140/90 average  We may begin BP lowering medication  in addition to life style depending .   Healthy eating activity and some weight loss may help  DASH eating  plan can help  [email or web address] Mediterranean diet  Please bring your blood pressure cuff to next appointment Take blood pressure readings twice a day for 5-7 days and record .     Take 2 -3 readings at each sitting .   Can send in readings  by My Chart.    Before checking your blood pressure make sure: You are seated and quite for 5 min before checking Feet are flat on the floor Siting in chair with your back supported straight up and down Arm resting on table or arm of chair at heart level Bladder is empty You have NOT had caffeine or tobacco within the last 30 min  [email or web address]

## 2022-09-17 ENCOUNTER — Other Ambulatory Visit: Payer: Self-pay

## 2022-09-17 DIAGNOSIS — R748 Abnormal levels of other serum enzymes: Secondary | ICD-10-CM

## 2022-09-17 NOTE — Progress Notes (Signed)
Cholesterol the same almost at goal Blood sugar nl but A1c in prediabetic range. Only other  out of range was alk phos a bone liver enzyme sometimes can be off if vit d is low  Is barely abnormal  so advise  please order /check LFTs and vit D level  and  GGT ( all blood work before visit in summer and we can review then   dx elevated alkaline phosphatase

## 2022-10-30 ENCOUNTER — Encounter (INDEPENDENT_AMBULATORY_CARE_PROVIDER_SITE_OTHER): Payer: Self-pay

## 2022-11-14 ENCOUNTER — Other Ambulatory Visit (INDEPENDENT_AMBULATORY_CARE_PROVIDER_SITE_OTHER): Payer: 59

## 2022-11-14 ENCOUNTER — Ambulatory Visit (INDEPENDENT_AMBULATORY_CARE_PROVIDER_SITE_OTHER): Payer: 59

## 2022-11-14 DIAGNOSIS — Z23 Encounter for immunization: Secondary | ICD-10-CM | POA: Diagnosis not present

## 2022-11-14 DIAGNOSIS — R748 Abnormal levels of other serum enzymes: Secondary | ICD-10-CM | POA: Diagnosis not present

## 2022-11-14 LAB — GAMMA GT: GGT: 37 U/L (ref 7–51)

## 2022-11-14 LAB — HEPATIC FUNCTION PANEL
ALT: 20 U/L (ref 0–35)
AST: 19 U/L (ref 0–37)
Albumin: 4.3 g/dL (ref 3.5–5.2)
Alkaline Phosphatase: 117 U/L (ref 39–117)
Bilirubin, Direct: 0.1 mg/dL (ref 0.0–0.3)
Total Bilirubin: 0.3 mg/dL (ref 0.2–1.2)
Total Protein: 7.4 g/dL (ref 6.0–8.3)

## 2022-11-14 LAB — VITAMIN D 25 HYDROXY (VIT D DEFICIENCY, FRACTURES): VITD: 41.49 ng/mL (ref 30.00–100.00)

## 2022-11-20 NOTE — Progress Notes (Signed)
Blood tests are  now  normal  will discuss at fu visit

## 2022-11-27 NOTE — Progress Notes (Deleted)
No chief complaint on file.   HPI: Lauren Owens 52 y.o. come in for Chronic disease management   BP management  Fu alk phos level  ROS: See pertinent positives and negatives per HPI.  Past Medical History:  Diagnosis Date   Blood glucose elevated    borderline   Migraine    Physiological tremor    exagerated has used b blocker in past    Family History  Problem Relation Age of Onset   Hypertension Other    Gallbladder disease Mother    Heart failure Mother    Colon cancer Mother 91   Lung cancer Other    Coronary artery disease Father        in his 42's cabg x 5   Cancer Father    Colon polyps Neg Hx    Esophageal cancer Neg Hx    Rectal cancer Neg Hx    Stomach cancer Neg Hx     Social History   Socioeconomic History   Marital status: Married    Spouse name: Not on file   Number of children: Not on file   Years of education: Not on file   Highest education level: Not on file  Occupational History   Not on file  Tobacco Use   Smoking status: Never   Smokeless tobacco: Never  Vaping Use   Vaping status: Never Used  Substance and Sexual Activity   Alcohol use: No   Drug use: No   Sexual activity: Not on file  Other Topics Concern   Not on file  Social History Narrative   Married   Second marriage   HH of 3 1 dog and 1 cat  fam animals    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    [employer]   Commuting from [place].    Has a farm and blended family   2 dog and GP   Drinks milk and lots of caffeine   Not regular exercise   Sleep 5 hours   Social Determinants of Health   Financial Resource Strain: Not on file  Food Insecurity: Not on file  Transportation Needs: Not on file  Physical Activity: Insufficiently Active (08/28/2022)   Exercise Vital Sign    Days of Exercise per Week: 1 day    Minutes of Exercise per Session: 30 min  Stress: No Stress Concern Present (08/28/2022)   [employer] of Occupational Health -  Occupational Stress Questionnaire    Feeling of Stress : Not at all  Social Connections: Socially Integrated (08/28/2022)   Social Connection and Isolation Panel [NHANES]    Frequency of Communication with Friends and Family: More than three times a week    Frequency of Social Gatherings with Friends and Family: Once a week    Attends Religious Services: More than 4 times per year    Active Member of [employer] or Organizations: Yes    Attends Engineer, structural: More than 4 times per year    Marital Status: Married    Outpatient Medications Prior to Visit  Medication Sig Dispense Refill   Cetirizine HCl (ZYRTEC PO) Take by mouth.     Multiple Vitamins-Minerals (CENTRUM WOMEN PO) Take 1 tablet by mouth daily.     Facility-Administered Medications Prior to Visit  Medication Dose Route Frequency Provider Last Rate Last Admin   0.9 %  sodium chloride infusion  500 mL Intravenous Continuous Danis, Andreas Blower, MD  EXAM:  There were no vitals taken for this visit.  There is no height or weight on file to calculate BMI.  GENERAL: vitals reviewed and listed above, alert, oriented, appears well hydrated and in no acute distress HEENT: atraumatic, conjunctiva  clear, no obvious abnormalities on inspection of external nose and ears OP : no lesion edema or exudate  NECK: no obvious masses on inspection palpation  LUNGS: clear to auscultation bilaterally, no wheezes, rales or rhonchi, good air movement CV: HRRR, no clubbing cyanosis or  peripheral edema nl cap refill  MS: moves all extremities without noticeable focal  abnormality PSYCH: pleasant and cooperative, no obvious depression or anxiety Lab Results  Component Value Date   WBC 7.6 08/28/2022   HGB 14.3 08/28/2022   HCT 44.5 08/28/2022   PLT 289.0 08/28/2022   GLUCOSE 94 08/28/2022   CHOL 178 08/28/2022   TRIG 59.0 08/28/2022   HDL 62.60 08/28/2022   LDLCALC 103 (H) 08/28/2022   ALT 20 11/14/2022   AST 19  11/14/2022   NA 141 08/28/2022   K 3.9 08/28/2022   CL 104 08/28/2022   CREATININE 0.79 08/28/2022   BUN 14 08/28/2022   CO2 28 08/28/2022   TSH 1.58 08/28/2022   HGBA1C 6.0 08/28/2022   BP Readings from Last 3 Encounters:  08/28/22 (!) 138/94  08/22/21 122/78  09/15/20 (!) 148/73    ASSESSMENT AND PLAN:  Discussed the following assessment and plan:  Elevated blood pressure reading  -Patient advised to return or notify health care team  if  new concerns arise.  There are no Patient Instructions on file for this visit.   Neta Mends. Marios Gaiser M.D.

## 2022-11-28 ENCOUNTER — Ambulatory Visit: Payer: 59 | Admitting: Internal Medicine

## 2022-11-28 DIAGNOSIS — R030 Elevated blood-pressure reading, without diagnosis of hypertension: Secondary | ICD-10-CM

## 2022-12-11 ENCOUNTER — Encounter: Payer: Self-pay | Admitting: Internal Medicine

## 2022-12-11 ENCOUNTER — Ambulatory Visit (INDEPENDENT_AMBULATORY_CARE_PROVIDER_SITE_OTHER): Payer: 59 | Admitting: Internal Medicine

## 2022-12-11 VITALS — BP 146/88 | HR 99 | Temp 98.1°F | Ht 62.0 in | Wt 169.4 lb

## 2022-12-11 DIAGNOSIS — Z833 Family history of diabetes mellitus: Secondary | ICD-10-CM

## 2022-12-11 DIAGNOSIS — I10 Essential (primary) hypertension: Secondary | ICD-10-CM

## 2022-12-11 DIAGNOSIS — Z79899 Other long term (current) drug therapy: Secondary | ICD-10-CM | POA: Diagnosis not present

## 2022-12-11 DIAGNOSIS — R030 Elevated blood-pressure reading, without diagnosis of hypertension: Secondary | ICD-10-CM

## 2022-12-11 MED ORDER — VALSARTAN 80 MG PO TABS
80.0000 mg | ORAL_TABLET | Freq: Every day | ORAL | 1 refills | Status: DC
Start: 2022-12-11 — End: 2023-02-11

## 2022-12-11 NOTE — Patient Instructions (Signed)
Begin  low dose  medication.  Continue lifestyle intervention healthy eating and exercise .   Plan  ROV in about 2 moths for fu  bp meds and may do fu lab at that time

## 2022-12-11 NOTE — Progress Notes (Signed)
Chief Complaint  Patient presents with   Medical Management of Chronic Issues    Pt reports she forgott her BP reading log but inform her Bp runs 135-140/98-99. Highest diastolic 100-101    HPI: Lauren GOULETTE 52 y.o. come in for Chronic disease management  FU  elevated BP readings and abn alk phos  level repeated   see last visit  5 29 Since that time   At home 135 plus some 140   and ocasss high 120s .  Changed multivitamin . Changing  diet out  fructose  and feeling better  Fam hx of chf in mom and Ht in many members  Bother is "on heart medicine" some have diabetes .   ROS: See pertinent positives and negatives per HPI.  Past Medical History:  Diagnosis Date   Blood glucose elevated    borderline   Migraine    Physiological tremor    exagerated has used b blocker in past    Family History  Problem Relation Age of Onset   Hypertension Other    Gallbladder disease Mother    Heart failure Mother    Colon cancer Mother 39   Lung cancer Other    Coronary artery disease Father        in his 62's cabg x 5   Cancer Father    Colon polyps Neg Hx    Esophageal cancer Neg Hx    Rectal cancer Neg Hx    Stomach cancer Neg Hx     Social History   Socioeconomic History   Marital status: Married    Spouse name: Not on file   Number of children: Not on file   Years of education: Not on file   Highest education level: Not on file  Occupational History   Not on file  Tobacco Use   Smoking status: Never   Smokeless tobacco: Never  Vaping Use   Vaping status: Never Used  Substance and Sexual Activity   Alcohol use: No   Drug use: No   Sexual activity: Not on file  Other Topics Concern   Not on file  Social History Narrative   Married   Second marriage   HH of 3 1 dog and 1 cat  fam animals    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    [employer]   Commuting from [place].    Has a farm and blended family   2 dog and GP   Drinks milk and lots of  caffeine   Not regular exercise   Sleep 5 hours   Social Determinants of Health   Financial Resource Strain: Not on file  Food Insecurity: Not on file  Transportation Needs: Not on file  Physical Activity: Insufficiently Active (08/28/2022)   Exercise Vital Sign    Days of Exercise per Week: 1 day    Minutes of Exercise per Session: 30 min  Stress: No Stress Concern Present (08/28/2022)   [employer] of Occupational Health - Occupational Stress Questionnaire    Feeling of Stress : Not at all  Social Connections: Socially Integrated (08/28/2022)   Social Connection and Isolation Panel [NHANES]    Frequency of Communication with Friends and Family: More than three times a week    Frequency of Social Gatherings with Friends and Family: Once a week    Attends Religious Services: More than 4 times per year    Active Member of Clubs or Organizations: Yes    Attends  Engineer, structural: More than 4 times per year    Marital Status: Married    Outpatient Medications Prior to Visit  Medication Sig Dispense Refill   Cetirizine HCl (ZYRTEC PO) Take by mouth.     Multiple Vitamins-Minerals (CENTRUM WOMEN PO) Take 1 tablet by mouth daily.     Facility-Administered Medications Prior to Visit  Medication Dose Route Frequency Provider Last Rate Last Admin   0.9 %  sodium chloride infusion  500 mL Intravenous Continuous Danis, Starr Lake III, MD         EXAM:  BP (!) 146/88 (BP Location: Left Arm, Patient Position: Sitting, Cuff Size: Large)   Pulse 99   Temp 98.1 F (36.7 C) (Oral)   Ht 5\' 2"  (1.575 m)   Wt 169 lb 6.4 oz (76.8 kg)   SpO2 98%   BMI 30.98 kg/m   Body mass index is 30.98 kg/m.  GENERAL: vitals reviewed and listed above, alert, oriented, appears well hydrated and in no acute distress HEENT: atraumatic, conjunctiva  clear, no obvious abnormalities on inspection of external nose and ears PSYCH: pleasant and cooperative, no obvious depression or anxiety Lab  Results  Component Value Date   WBC 7.6 08/28/2022   HGB 14.3 08/28/2022   HCT 44.5 08/28/2022   PLT 289.0 08/28/2022   GLUCOSE 94 08/28/2022   CHOL 178 08/28/2022   TRIG 59.0 08/28/2022   HDL 62.60 08/28/2022   LDLCALC 103 (H) 08/28/2022   ALT 20 11/14/2022   AST 19 11/14/2022   NA 141 08/28/2022   K 3.9 08/28/2022   CL 104 08/28/2022   CREATININE 0.79 08/28/2022   BUN 14 08/28/2022   CO2 28 08/28/2022   TSH 1.58 08/28/2022   HGBA1C 6.0 08/28/2022   BP Readings from Last 3 Encounters:  12/11/22 (!) 146/88  08/28/22 (!) 138/94  08/22/21 122/78   Lab Results  Component Value Date   ALKPHOS 117 11/14/2022   Last vitamin D Lab Results  Component Value Date   VD25OH 41.49 11/14/2022    ASSESSMENT AND PLAN:  Discussed the following assessment and plan:  Elevated blood pressure reading  Family history of diabetes mellitus  Medication management  Essential hypertension Very strong fam hx of ht  hf in mom  heart in broth and  also cancer otherwise   Err on the side of good control  have her continue lsi and add low dose valsartan that may need to adjust . .  Plan rov in 2 months  can update bmp at that time as indicated  -Patient advised to return or notify health care team  if  new concerns arise.  Patient Instructions  Begin  low dose  medication.  Continue lifestyle intervention healthy eating and exercise .   Plan  ROV in about 2 moths for fu  bp meds and may do fu lab at that time    Neta Mends. Adonis Yim M.D.

## 2023-02-10 NOTE — Progress Notes (Unsigned)
No chief complaint on file.   HPI: Lauren Owens 52 y.o. come in for Chronic disease management  Fu  Bp elevation and hs ov elevated alk phos transient  ROS: See pertinent positives and negatives per HPI.  Past Medical History:  Diagnosis Date   Blood glucose elevated    borderline   Migraine    Physiological tremor    exagerated has used b blocker in past    Family History  Problem Relation Age of Onset   Hypertension Other    Gallbladder disease Mother    Heart failure Mother    Colon cancer Mother 73   Lung cancer Other    Coronary artery disease Father        in his 55's cabg x 5   Cancer Father    Colon polyps Neg Hx    Esophageal cancer Neg Hx    Rectal cancer Neg Hx    Stomach cancer Neg Hx     Social History   Socioeconomic History   Marital status: Married    Spouse name: Not on file   Number of children: Not on file   Years of education: Not on file   Highest education level: Not on file  Occupational History   Not on file  Tobacco Use   Smoking status: Never   Smokeless tobacco: Never  Vaping Use   Vaping status: Never Used  Substance and Sexual Activity   Alcohol use: No   Drug use: No   Sexual activity: Not on file  Other Topics Concern   Not on file  Social History Narrative   Married   Second marriage   HH of 3 1 dog and 1 cat  fam animals    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    [employer]   Commuting from [place].    Has a farm and blended family   2 dog and GP   Drinks milk and lots of caffeine   Not regular exercise   Sleep 5 hours   Social Determinants of Health   Financial Resource Strain: Not on file  Food Insecurity: Not on file  Transportation Needs: Not on file  Physical Activity: Insufficiently Active (08/28/2022)   Exercise Vital Sign    Days of Exercise per Week: 1 day    Minutes of Exercise per Session: 30 min  Stress: No Stress Concern Present (08/28/2022)   [employer] of Occupational  Health - Occupational Stress Questionnaire    Feeling of Stress : Not at all  Social Connections: Socially Integrated (08/28/2022)   Social Connection and Isolation Panel [NHANES]    Frequency of Communication with Friends and Family: More than three times a week    Frequency of Social Gatherings with Friends and Family: Once a week    Attends Religious Services: More than 4 times per year    Active Member of [employer] or Organizations: Yes    Attends Engineer, structural: More than 4 times per year    Marital Status: Married    Outpatient Medications Prior to Visit  Medication Sig Dispense Refill   Cetirizine HCl (ZYRTEC PO) Take by mouth.     Multiple Vitamins-Minerals (CENTRUM WOMEN PO) Take 1 tablet by mouth daily.     valsartan (DIOVAN) 80 MG tablet Take 1 tablet (80 mg total) by mouth daily. 90 tablet 1   Facility-Administered Medications Prior to Visit  Medication Dose Route Frequency Provider Last Rate Last Admin  0.9 %  sodium chloride infusion  500 mL Intravenous Continuous Danis, Starr Lake III, MD         EXAM:  There were no vitals taken for this visit.  There is no height or weight on file to calculate BMI.  GENERAL: vitals reviewed and listed above, alert, oriented, appears well hydrated and in no acute distress HEENT: atraumatic, conjunctiva  clear, no obvious abnormalities on inspection of external nose and ears OP : no lesion edema or exudate  NECK: no obvious masses on inspection palpation  LUNGS: clear to auscultation bilaterally, no wheezes, rales or rhonchi, good air movement CV: HRRR, no clubbing cyanosis or  peripheral edema nl cap refill  MS: moves all extremities without noticeable focal  abnormality PSYCH: pleasant and cooperative, no obvious depression or anxiety Lab Results  Component Value Date   WBC 7.6 08/28/2022   HGB 14.3 08/28/2022   HCT 44.5 08/28/2022   PLT 289.0 08/28/2022   GLUCOSE 94 08/28/2022   CHOL 178 08/28/2022   TRIG 59.0  08/28/2022   HDL 62.60 08/28/2022   LDLCALC 103 (H) 08/28/2022   ALT 20 11/14/2022   AST 19 11/14/2022   NA 141 08/28/2022   K 3.9 08/28/2022   CL 104 08/28/2022   CREATININE 0.79 08/28/2022   BUN 14 08/28/2022   CO2 28 08/28/2022   TSH 1.58 08/28/2022   HGBA1C 6.0 08/28/2022   BP Readings from Last 3 Encounters:  12/11/22 (!) 146/88  08/28/22 (!) 138/94  08/22/21 122/78    ASSESSMENT AND PLAN:  Discussed the following assessment and plan:  No diagnosis found.  -Patient advised to return or notify health care team  if  new concerns arise.  There are no Patient Instructions on file for this visit.   Neta Mends. Yashas Camilli M.D.

## 2023-02-11 ENCOUNTER — Other Ambulatory Visit: Payer: Self-pay | Admitting: Internal Medicine

## 2023-02-11 ENCOUNTER — Encounter: Payer: Self-pay | Admitting: Internal Medicine

## 2023-02-11 ENCOUNTER — Ambulatory Visit (INDEPENDENT_AMBULATORY_CARE_PROVIDER_SITE_OTHER): Payer: 59 | Admitting: Internal Medicine

## 2023-02-11 VITALS — BP 158/98 | HR 74 | Temp 98.2°F | Ht 62.0 in | Wt 170.0 lb

## 2023-02-11 DIAGNOSIS — Z79899 Other long term (current) drug therapy: Secondary | ICD-10-CM | POA: Diagnosis not present

## 2023-02-11 DIAGNOSIS — I10 Essential (primary) hypertension: Secondary | ICD-10-CM | POA: Diagnosis not present

## 2023-02-11 DIAGNOSIS — Z833 Family history of diabetes mellitus: Secondary | ICD-10-CM | POA: Diagnosis not present

## 2023-02-11 DIAGNOSIS — R739 Hyperglycemia, unspecified: Secondary | ICD-10-CM

## 2023-02-11 MED ORDER — VALSARTAN-HYDROCHLOROTHIAZIDE 160-12.5 MG PO TABS
1.0000 | ORAL_TABLET | Freq: Every day | ORAL | 3 refills | Status: DC
Start: 2023-02-11 — End: 2023-06-04

## 2023-02-11 NOTE — Progress Notes (Signed)
Future orders fu bp med change

## 2023-02-11 NOTE — Patient Instructions (Signed)
Bp still up some. Change bp  med  Check lab in 3-4 weeks after change  Send in readings  Plan ROV ok virtual or in person  about bp  in about 2 months  If bp too low 100 range contact us for advise .

## 2023-03-03 ENCOUNTER — Other Ambulatory Visit: Payer: 59

## 2023-03-11 ENCOUNTER — Other Ambulatory Visit: Payer: 59

## 2023-04-15 ENCOUNTER — Telehealth (INDEPENDENT_AMBULATORY_CARE_PROVIDER_SITE_OTHER): Payer: 59 | Admitting: Internal Medicine

## 2023-04-15 ENCOUNTER — Encounter: Payer: Self-pay | Admitting: Internal Medicine

## 2023-04-15 VITALS — Ht 62.0 in | Wt 170.0 lb

## 2023-04-15 DIAGNOSIS — I10 Essential (primary) hypertension: Secondary | ICD-10-CM

## 2023-04-15 NOTE — Progress Notes (Signed)
 Virtual Visit via Video Note  I connected with Lauren Owens on 04/15/23 at  4:00 PM EST by a video enabled telemedicine application and verified that I am speaking with the correct person using two identifiers. Location patient: vehicle  Location provider:work office Persons participating in the virtual visit: patient, provider   Patient aware  of the limitations of evaluation and management by telemedicine and  availability of in person appointments. and agreed to proceed.   HPI: Lauren Owens presents for video visit Chief Complaint  Patient presents with   Medical Management of Chronic Issues    Follow up on HTN. Pt reports she check her BP this morning. It was 137/68 and at work during lunch time 142/82.    Changed bp med to valsartan  hydrochlorothiazide  and   Dizzy at onset and maybe tired and now subsided   remind to drink and then dry mouth  But feels better overall than in past meds . Bp ok when not at work 130 range   ROS: See pertinent positives and negatives per HPI.  Past Medical History:  Diagnosis Date   Blood glucose elevated    borderline   Migraine    Physiological tremor    exagerated has used b blocker in past    Past Surgical History:  Procedure Laterality Date   CESAREAN SECTION     CHOLECYSTECTOMY      Family History  Problem Relation Age of Onset   Hypertension Other    Gallbladder disease Mother    Heart failure Mother    Colon cancer Mother 36   Lung cancer Other    Coronary artery disease Father        in his 15's cabg x 5   Cancer Father    Colon polyps Neg Hx    Esophageal cancer Neg Hx    Rectal cancer Neg Hx    Stomach cancer Neg Hx     Social History   Tobacco Use   Smoking status: Never   Smokeless tobacco: Never  Vaping Use   Vaping status: Never Used  Substance Use Topics   Alcohol use: No   Drug use: No      Current Outpatient Medications:    Cetirizine HCl (ZYRTEC PO), Take by mouth., Disp: , Rfl:     Multiple Vitamins-Minerals (CENTRUM WOMEN PO), Take 1 tablet by mouth daily., Disp: , Rfl:    valsartan -hydrochlorothiazide  (DIOVAN -HCT) 160-12.5 MG tablet, Take 1 tablet by mouth daily. Change from  valsartan  80, Disp: 30 tablet, Rfl: 3  Current Facility-Administered Medications:    0.9 %  sodium chloride  infusion, 500 mL, Intravenous, Continuous, Danis, Victory CROME III, MD  EXAM: BP Readings from Last 3 Encounters:  02/11/23 (!) 158/98  12/11/22 (!) 146/88  08/28/22 (!) 138/94   Improved  VITALS per patient if applicable:  GENERAL: alert, oriented, appears well and in no acute distress  HEENT: atraumatic, conjunttiva clear, no obvious abnormalities on inspection of external nose and ears  NECK: normal movements of the head and neck  LUNGS: on inspection no signs of respiratory distress, breathing rate appears normal, no obvious gross SOB, gasping or wheezing  CV: no obvious cyanosis  MS: moves all visible extremities without noticeable abnormality  PSYCH/NEURO: pleasant and cooperative, no obvious depression or anxiety, speech and thought processing grossly intact Lab Results  Component Value Date   WBC 7.6 08/28/2022   HGB 14.3 08/28/2022   HCT 44.5 08/28/2022   PLT 289.0 08/28/2022  GLUCOSE 94 08/28/2022   CHOL 178 08/28/2022   TRIG 59.0 08/28/2022   HDL 62.60 08/28/2022   LDLCALC 103 (H) 08/28/2022   ALT 20 11/14/2022   AST 19 11/14/2022   NA 141 08/28/2022   K 3.9 08/28/2022   CL 104 08/28/2022   CREATININE 0.79 08/28/2022   BUN 14 08/28/2022   CO2 28 08/28/2022   TSH 1.58 08/28/2022   HGBA1C 6.0 08/28/2022    ASSESSMENT AND PLAN:  Discussed the following assessment and plan:    ICD-10-CM   1. Essential hypertension  I10     Feeling much better and seems to tolerate well  bp ok when not at work  but not as high at work  as in past Continue  Lab  appt for bmp and A1cand then fu in 2 mos with monitor   ot correlate  Counseled. Fu earlier if needed     Expectant management and discussion of plan and treatment with opportunity to ask questions and all were answered. The patient agreed with the plan and demonstrated an understanding of the instructions.   Advised to call back or seek an in-person evaluation if worsening  or having  further concerns  in interim. Return in about 2 months (around 06/13/2023) for lab  appt when convenient .  Lauren Eastern, MD

## 2023-05-30 ENCOUNTER — Other Ambulatory Visit: Payer: 59

## 2023-05-30 DIAGNOSIS — I10 Essential (primary) hypertension: Secondary | ICD-10-CM

## 2023-05-30 DIAGNOSIS — Z79899 Other long term (current) drug therapy: Secondary | ICD-10-CM

## 2023-05-30 DIAGNOSIS — R739 Hyperglycemia, unspecified: Secondary | ICD-10-CM

## 2023-05-30 NOTE — Addendum Note (Signed)
 Addended by: Gertie Baron D on: 05/30/2023 03:46 PM   Modules accepted: Orders

## 2023-05-31 LAB — BASIC METABOLIC PANEL
BUN: 13 mg/dL (ref 7–25)
CO2: 25 mmol/L (ref 20–32)
Calcium: 9.7 mg/dL (ref 8.6–10.4)
Chloride: 104 mmol/L (ref 98–110)
Creat: 0.81 mg/dL (ref 0.50–1.03)
Glucose, Bld: 103 mg/dL — ABNORMAL HIGH (ref 65–99)
Potassium: 3.9 mmol/L (ref 3.5–5.3)
Sodium: 140 mmol/L (ref 135–146)

## 2023-05-31 LAB — HEMOGLOBIN A1C
Hgb A1c MFr Bld: 6.1 %{Hb} — ABNORMAL HIGH (ref <5.7)
Mean Plasma Glucose: 128 mg/dL
eAG (mmol/L): 7.1 mmol/L

## 2023-06-03 ENCOUNTER — Encounter: Payer: Self-pay | Admitting: Internal Medicine

## 2023-06-03 NOTE — Progress Notes (Signed)
 Blood sugar stable  in prediabetic range   kidney function stable  and potassium in normal range  and

## 2023-06-04 ENCOUNTER — Other Ambulatory Visit: Payer: Self-pay | Admitting: Internal Medicine

## 2023-06-19 ENCOUNTER — Encounter: Payer: Self-pay | Admitting: Gastroenterology

## 2023-07-01 NOTE — Progress Notes (Unsigned)
 No chief complaint on file.   HPI: Lauren Owens 53 y.o. come in for Chronic disease management   Fu BP   ROS: See pertinent positives and negatives per HPI.  Past Medical History:  Diagnosis Date   Blood glucose elevated    borderline   Migraine    Physiological tremor    exagerated has used b blocker in past    Family History  Problem Relation Age of Onset   Hypertension Other    Gallbladder disease Mother    Heart failure Mother    Colon cancer Mother 22   Lung cancer Other    Coronary artery disease Father        in his 1's cabg x 5   Cancer Father    Colon polyps Neg Hx    Esophageal cancer Neg Hx    Rectal cancer Neg Hx    Stomach cancer Neg Hx     Social History   Socioeconomic History   Marital status: Married    Spouse name: Not on file   Number of children: Not on file   Years of education: Not on file   Highest education level: Not on file  Occupational History   Not on file  Tobacco Use   Smoking status: Never   Smokeless tobacco: Never  Vaping Use   Vaping status: Never Used  Substance and Sexual Activity   Alcohol use: No   Drug use: No   Sexual activity: Not on file  Other Topics Concern   Not on file  Social History Narrative   Married   Second marriage   HH of 3 1 dog and 1 cat  fam animals    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    [place]   Commuting from [place].    Has a farm and blended family   2 dog and GP   Drinks milk and lots of caffeine   Not regular exercise   Sleep 5 hours   Social Drivers of Health   Financial Resource Strain: Not on file  Food Insecurity: Not on file  Transportation Needs: Not on file  Physical Activity: Insufficiently Active (08/28/2022)   Exercise Vital Sign    Days of Exercise per Week: 1 day    Minutes of Exercise per Session: 30 min  Stress: No Stress Concern Present (08/28/2022)   [employer] of Occupational Health - Occupational Stress Questionnaire     Feeling of Stress : Not at all  Social Connections: Socially Integrated (08/28/2022)   Social Connection and Isolation Panel [NHANES]    Frequency of Communication with Friends and Family: More than three times a week    Frequency of Social Gatherings with Friends and Family: Once a week    Attends Religious Services: More than 4 times per year    Active Member of [employer] or Organizations: Yes    Attends Engineer, structural: More than 4 times per year    Marital Status: Married    Outpatient Medications Prior to Visit  Medication Sig Dispense Refill   Cetirizine HCl (ZYRTEC PO) Take by mouth.     Multiple Vitamins-Minerals (CENTRUM WOMEN PO) Take 1 tablet by mouth daily.     valsartan-hydrochlorothiazide (DIOVAN-HCT) 160-12.5 MG tablet TAKE 1 TABLET BY MOUTH EVERY DAY 30 tablet 3   Facility-Administered Medications Prior to Visit  Medication Dose Route Frequency Provider Last Rate Last Admin   0.9 %  sodium chloride infusion  500 mL  Intravenous Continuous Sherrilyn Rist, MD         EXAM:  There were no vitals taken for this visit.  There is no height or weight on file to calculate BMI.  GENERAL: vitals reviewed and listed above, alert, oriented, appears well hydrated and in no acute distress HEENT: atraumatic, conjunctiva  clear, no obvious abnormalities on inspection of external nose and ears OP : no lesion edema or exudate  NECK: no obvious masses on inspection palpation  LUNGS: clear to auscultation bilaterally, no wheezes, rales or rhonchi, good air movement CV: HRRR, no clubbing cyanosis or  peripheral edema nl cap refill  MS: moves all extremities without noticeable focal  abnormality PSYCH: pleasant and cooperative, no obvious depression or anxiety Lab Results  Component Value Date   WBC 7.6 08/28/2022   HGB 14.3 08/28/2022   HCT 44.5 08/28/2022   PLT 289.0 08/28/2022   GLUCOSE 103 (H) 05/30/2023   CHOL 178 08/28/2022   TRIG 59.0 08/28/2022   HDL 62.60  08/28/2022   LDLCALC 103 (H) 08/28/2022   ALT 20 11/14/2022   AST 19 11/14/2022   NA 140 05/30/2023   K 3.9 05/30/2023   CL 104 05/30/2023   CREATININE 0.81 05/30/2023   BUN 13 05/30/2023   CO2 25 05/30/2023   TSH 1.58 08/28/2022   HGBA1C 6.1 (H) 05/30/2023   BP Readings from Last 3 Encounters:  02/11/23 (!) 158/98  12/11/22 (!) 146/88  08/28/22 (!) 138/94    ASSESSMENT AND PLAN:  Discussed the following assessment and plan:  No diagnosis found.  -Patient advised to return or notify health care team  if  new concerns arise.  There are no Patient Instructions on file for this visit.   Neta Mends. Yaneisy Wenz M.D.

## 2023-07-02 ENCOUNTER — Ambulatory Visit (INDEPENDENT_AMBULATORY_CARE_PROVIDER_SITE_OTHER): Payer: 59 | Admitting: Internal Medicine

## 2023-07-02 ENCOUNTER — Encounter: Payer: Self-pay | Admitting: Internal Medicine

## 2023-07-02 ENCOUNTER — Other Ambulatory Visit: Payer: Self-pay | Admitting: Internal Medicine

## 2023-07-02 VITALS — BP 128/85 | HR 96 | Temp 97.8°F | Ht 62.0 in | Wt 164.4 lb

## 2023-07-02 DIAGNOSIS — Z79899 Other long term (current) drug therapy: Secondary | ICD-10-CM

## 2023-07-02 DIAGNOSIS — I10 Essential (primary) hypertension: Secondary | ICD-10-CM | POA: Diagnosis not present

## 2023-07-02 DIAGNOSIS — R739 Hyperglycemia, unspecified: Secondary | ICD-10-CM

## 2023-07-02 DIAGNOSIS — Z833 Family history of diabetes mellitus: Secondary | ICD-10-CM

## 2023-07-02 DIAGNOSIS — Z0000 Encounter for general adult medical examination without abnormal findings: Secondary | ICD-10-CM

## 2023-07-02 NOTE — Patient Instructions (Addendum)
 Continue  Continue lifestyle intervention healthy eating and exercise . And medication   Monitor as indicated . Bp goal eventually  130/80 and below average.   Make appt for your yearly cpe in July 25 and fu BP   Wt Readings from Last 3 Encounters:  07/02/23 164 lb 6.4 oz (74.6 kg)  04/15/23 170 lb (77.1 kg)  02/11/23 170 lb (77.1 kg)

## 2023-07-02 NOTE — Progress Notes (Signed)
 Future orders per cpe

## 2023-08-22 ENCOUNTER — Ambulatory Visit (AMBULATORY_SURGERY_CENTER)

## 2023-08-22 VITALS — Ht 62.0 in | Wt 165.0 lb

## 2023-08-22 DIAGNOSIS — Z800 Family history of malignant neoplasm of digestive organs: Secondary | ICD-10-CM

## 2023-08-22 MED ORDER — NA SULFATE-K SULFATE-MG SULF 17.5-3.13-1.6 GM/177ML PO SOLN: 1.0000 | Freq: Once | ORAL | 0 refills | Status: AC

## 2023-08-22 NOTE — Progress Notes (Signed)

## 2023-09-01 ENCOUNTER — Encounter: Payer: Self-pay | Admitting: Gastroenterology

## 2023-09-11 ENCOUNTER — Telehealth: Payer: Self-pay | Admitting: Gastroenterology

## 2023-09-11 NOTE — Telephone Encounter (Signed)
 Inbound call from patient, would like clarification on when she can take valsartan -hydrochlorothiazide . Her procedure is scheduled for  6/13 at 7:00 AM. Please advise.

## 2023-09-11 NOTE — Telephone Encounter (Signed)
 Spoke with pt; told her she could take medication as she normally would.  Understanding voiced.

## 2023-09-12 ENCOUNTER — Ambulatory Visit (AMBULATORY_SURGERY_CENTER): Admitting: Gastroenterology

## 2023-09-12 ENCOUNTER — Encounter: Payer: Self-pay | Admitting: Gastroenterology

## 2023-09-12 VITALS — BP 124/75 | HR 90 | Temp 97.9°F | Resp 13 | Ht 62.0 in | Wt 165.0 lb

## 2023-09-12 DIAGNOSIS — D123 Benign neoplasm of transverse colon: Secondary | ICD-10-CM | POA: Diagnosis not present

## 2023-09-12 DIAGNOSIS — Z860100 Personal history of colon polyps, unspecified: Secondary | ICD-10-CM

## 2023-09-12 DIAGNOSIS — Z800 Family history of malignant neoplasm of digestive organs: Secondary | ICD-10-CM

## 2023-09-12 DIAGNOSIS — Z1211 Encounter for screening for malignant neoplasm of colon: Secondary | ICD-10-CM | POA: Diagnosis present

## 2023-09-12 MED ORDER — SODIUM CHLORIDE 0.9 % IV SOLN: 500.0000 mL | Freq: Once | INTRAVENOUS | Status: DC

## 2023-09-12 NOTE — Patient Instructions (Signed)
Thank you for letting us take care of your healthcare needs today. Please see handouts given to you on Polyps.    YOU HAD AN ENDOSCOPIC PROCEDURE TODAY AT THE [facility]:   Refer to the procedure report that was given to you for any specific questions about what was found during the examination.  If the procedure report does not answer your questions, please call your gastroenterologist to clarify.  If you requested that your care partner not be given the details of your procedure findings, then the procedure report has been included in a sealed envelope for you to review at your convenience later.  YOU SHOULD EXPECT: Some feelings of bloating in the abdomen. Passage of more gas than usual.  Walking can help get rid of the air that was put into your GI tract during the procedure and reduce the bloating. If you had a lower endoscopy (such as a colonoscopy or flexible sigmoidoscopy) you may notice spotting of blood in your stool or on the toilet paper. If you underwent a bowel prep for your procedure, you may not have a normal bowel movement for a few days.  Please Note:  You might notice some irritation and congestion in your nose or some drainage.  This is from the oxygen used during your procedure.  There is no need for concern and it should clear up in a day or so.  SYMPTOMS TO REPORT IMMEDIATELY:  Following lower endoscopy (colonoscopy or flexible sigmoidoscopy):  Excessive amounts of blood in the stool  Significant tenderness or worsening of abdominal pains  Swelling of the abdomen that is new, acute  Fever of 100F or higher   For urgent or emergent issues, a gastroenterologist can be reached at any hour by calling [phone]. Do not use MyChart messaging for urgent concerns.    DIET:  We do recommend a small meal at first, but then you may proceed to your regular diet.  Drink plenty of fluids but you should avoid alcoholic beverages for 24 hours.  ACTIVITY:  You  should plan to take it easy for the rest of today and you should NOT DRIVE or use heavy machinery until tomorrow (because of the sedation medicines used during the test).    FOLLOW UP: Our staff will call the number listed on your records the next business day following your procedure.  We will call around 7:15- 8:00 am to check on you and address any questions or concerns that you may have regarding the information given to you following your procedure. If we do not reach you, we will leave a message.     If any biopsies were taken you will be contacted by phone or by letter within the next 1-3 weeks.  Please call us at [phone] if you have not heard about the biopsies in 3 weeks.    SIGNATURES/CONFIDENTIALITY: You and/or your care partner have signed paperwork which will be entered into your electronic medical record.  These signatures attest to the fact that that the information above on your After Visit Summary has been reviewed and is understood.  Full responsibility of the confidentiality of this discharge information lies with you and/or your care-partner. 

## 2023-09-12 NOTE — Op Note (Signed)
 [facility] Patient Name: Lauren Owens Procedure Date: 09/12/2023 7:01 AM MRN: [id number] Endoscopist: Ace Abu L. Dominic Friendly , MD, [id number] Age: 53 Referring MD:  Date of Birth: 1971-01-29 Gender: Female Account #: [id number] Procedure:                Colonoscopy Indications:              Screening in patient at increased risk: Colorectal                            cancer in mother before age 3,                           Surveillance: Personal history of adenomatous                            polyps on last colonoscopy 3 years ago (10mm Asc                            colon TA in June 2022) Medicines:                Monitored Anesthesia Care Procedure:                Pre-Anesthesia Assessment:                           - Prior to the procedure, a History and Physical                            was performed, and patient medications and                            allergies were reviewed. The patient's tolerance of                            previous anesthesia was also reviewed. The risks                            and benefits of the procedure and the sedation                            options and risks were discussed with the patient.                            All questions were answered, and informed consent                            was obtained. Prior Anticoagulants: The patient has                            taken no anticoagulant or antiplatelet agents. ASA                            Grade Assessment: II - A patient with mild systemic  disease. After reviewing the risks and benefits,                            the patient was deemed in satisfactory condition to                            undergo the procedure.                           After obtaining informed consent, the colonoscope                            was passed under direct vision. Throughout the                            procedure, the patient's blood pressure, pulse, and                             oxygen saturations were monitored continuously. The                            Olympus Scope SN: [id number] was introduced through                            the anus and advanced to the the cecum, identified                            by appendiceal orifice and ileocecal valve. The                            colonoscopy was performed without difficulty. The                            patient tolerated the procedure well. The quality                            of the bowel preparation was excellent. The                            ileocecal valve, appendiceal orifice, and rectum                            were photographed. Scope In: 8:05:29 AM Scope Out: 8:19:35 AM Scope Withdrawal Time: 0 hours 10 minutes 57 seconds  Total Procedure Duration: 0 hours 14 minutes 6 seconds  Findings:                 The perianal and digital rectal examinations were                            normal.                           Repeat examination of right colon under NBI  performed.                           A diminutive polyp was found in the mid transverse                            colon. The polyp was semi-sessile. The polyp was                            removed with a cold snare. Resection and retrieval                            were complete.                           The exam was otherwise without abnormality on                            direct and retroflexion views. Complications:            No immediate complications. Estimated Blood Loss:     Estimated blood loss was minimal. Impression:               - One diminutive polyp in the mid transverse colon,                            removed with a cold snare. Resected and retrieved.                           - The examination was otherwise normal on direct                            and retroflexion views. Recommendation:           - Patient has a contact number available for                             emergencies. The signs and symptoms of potential                            delayed complications were discussed with the                            patient. Return to normal activities tomorrow.                            Written discharge instructions were provided to the                            patient.                           - Resume previous diet.                           - Continue present medications.                           -  Await pathology results.                           - Repeat colonoscopy in 5 years for surveillance                            and Fam Hx CRC. Selah Klang L. Dominic Friendly, MD 09/12/2023 8:23:01 AM This report has been signed electronically.

## 2023-09-12 NOTE — Progress Notes (Signed)
 History and Physical:  This patient presents for endoscopic testing for: Encounter Diagnosis  Name Primary?   Family history of malignant neoplasm of gastrointestinal tract Yes    53 year old patient here today for colonoscopy with the indication of colon polyp surveillance (10 mm ascending colon tubular adenoma in June 2022), and family history of colon cancer in her mother.  Patient is otherwise without complaints or active issues today.   Past Medical History: Past Medical History:  Diagnosis Date   Blood glucose elevated    borderline   Migraine    Physiological tremor    exagerated has used b blocker in past     Past Surgical History: Past Surgical History:  Procedure Laterality Date   CESAREAN SECTION     CHOLECYSTECTOMY      Allergies: Allergies  Allergen Reactions   Other Other (See Comments)    Allergy to cat. Sneezing, watery eyes.     Outpatient Meds: Current Outpatient Medications  Medication Sig Dispense Refill   Cetirizine HCl (ZYRTEC PO) Take by mouth.     Multiple Vitamins-Minerals (CENTRUM WOMEN PO) Take 1 tablet by mouth daily.     valsartan -hydrochlorothiazide  (DIOVAN -HCT) 160-12.5 MG tablet TAKE 1 TABLET BY MOUTH EVERY DAY 30 tablet 3   Current Facility-Administered Medications  Medication Dose Route Frequency Provider Last Rate Last Admin   0.9 %  sodium chloride  infusion  500 mL Intravenous Continuous Danis, Roel Clarity III, MD       0.9 %  sodium chloride  infusion  500 mL Intravenous Once Danis, Ladora Osterberg L III, MD          ___________________________________________________________________ Objective   Exam:  BP 137/75   Pulse 86   Temp 97.9 F (36.6 C)   Ht 5' 2 (1.575 m)   Wt 165 lb (74.8 kg)   SpO2 99%   BMI 30.18 kg/m   CV: regular , S1/S2 Resp: clear to auscultation bilaterally, normal RR and effort noted GI: soft, no tenderness, with active bowel sounds.   Assessment: Encounter Diagnosis  Name Primary?   Family history  of malignant neoplasm of gastrointestinal tract Yes     Plan: Colonoscopy   The benefits and risks of the planned procedure(s) were described in detail with the patient or (when appropriate) their health care proxy.  Risks were outlined as including, but not limited to, bleeding, infection, perforation, adverse medication reaction leading to cardiac or pulmonary decompensation, pancreatitis (if ERCP).  The limitation of incomplete mucosal visualization was also discussed.  No guarantees or warranties were given.  The patient is appropriate for an endoscopic procedure in the ambulatory setting.   - Lorella Roles, MD

## 2023-09-12 NOTE — Progress Notes (Signed)
 Called to room to assist during endoscopic procedure.  Patient ID and intended procedure confirmed with present staff. Received instructions for my participation in the procedure from the performing physician.

## 2023-09-12 NOTE — Progress Notes (Signed)
 Report to PACU, RN, vss, BBS= Clear.

## 2023-09-12 NOTE — Progress Notes (Signed)
 Pt's states no medical or surgical changes since previsit or office visit.

## 2023-09-15 ENCOUNTER — Telehealth: Payer: Self-pay

## 2023-09-15 NOTE — Telephone Encounter (Signed)
 Left message

## 2023-09-16 LAB — SURGICAL PATHOLOGY

## 2023-09-17 ENCOUNTER — Ambulatory Visit: Payer: Self-pay | Admitting: Gastroenterology

## 2023-10-02 ENCOUNTER — Other Ambulatory Visit

## 2023-10-02 DIAGNOSIS — Z833 Family history of diabetes mellitus: Secondary | ICD-10-CM

## 2023-10-02 DIAGNOSIS — Z13220 Encounter for screening for lipoid disorders: Secondary | ICD-10-CM | POA: Diagnosis not present

## 2023-10-02 DIAGNOSIS — I10 Essential (primary) hypertension: Secondary | ICD-10-CM

## 2023-10-02 DIAGNOSIS — Z0000 Encounter for general adult medical examination without abnormal findings: Secondary | ICD-10-CM | POA: Diagnosis not present

## 2023-10-02 DIAGNOSIS — R739 Hyperglycemia, unspecified: Secondary | ICD-10-CM | POA: Diagnosis not present

## 2023-10-02 DIAGNOSIS — Z79899 Other long term (current) drug therapy: Secondary | ICD-10-CM

## 2023-10-02 DIAGNOSIS — Z131 Encounter for screening for diabetes mellitus: Secondary | ICD-10-CM | POA: Diagnosis not present

## 2023-10-02 LAB — CBC WITH DIFFERENTIAL/PLATELET
Basophils Absolute: 0 10*3/uL (ref 0.0–0.1)
Basophils Relative: 0.3 % (ref 0.0–3.0)
Eosinophils Absolute: 0.1 10*3/uL (ref 0.0–0.7)
Eosinophils Relative: 0.8 % (ref 0.0–5.0)
HCT: 42.1 % (ref 36.0–46.0)
Hemoglobin: 14 g/dL (ref 12.0–15.0)
Lymphocytes Relative: 23.7 % (ref 12.0–46.0)
Lymphs Abs: 1.6 10*3/uL (ref 0.7–4.0)
MCHC: 33.2 g/dL (ref 30.0–36.0)
MCV: 85.6 fl (ref 78.0–100.0)
Monocytes Absolute: 0.5 10*3/uL (ref 0.1–1.0)
Monocytes Relative: 7.4 % (ref 3.0–12.0)
Neutro Abs: 4.5 10*3/uL (ref 1.4–7.7)
Neutrophils Relative %: 67.8 % (ref 43.0–77.0)
Platelets: 255 10*3/uL (ref 150.0–400.0)
RBC: 4.92 Mil/uL (ref 3.87–5.11)
RDW: 14 % (ref 11.5–15.5)
WBC: 6.6 10*3/uL (ref 4.0–10.5)

## 2023-10-02 LAB — LIPID PANEL
Cholesterol: 190 mg/dL (ref 0–200)
HDL: 55.8 mg/dL (ref >39.00)
LDL Cholesterol: 118 mg/dL — ABNORMAL HIGH (ref 0–99)
NonHDL: 134.34
Total CHOL/HDL Ratio: 3
Triglycerides: 80 mg/dL (ref 0.0–149.0)
VLDL: 16 mg/dL (ref 0.0–40.0)

## 2023-10-02 LAB — HEPATIC FUNCTION PANEL
ALT: 19 U/L (ref 0–35)
AST: 17 U/L (ref 0–37)
Albumin: 4.3 g/dL (ref 3.5–5.2)
Alkaline Phosphatase: 107 U/L (ref 39–117)
Bilirubin, Direct: 0.1 mg/dL (ref 0.0–0.3)
Total Bilirubin: 0.5 mg/dL (ref 0.2–1.2)
Total Protein: 7.5 g/dL (ref 6.0–8.3)

## 2023-10-02 LAB — BASIC METABOLIC PANEL WITH GFR
BUN: 18 mg/dL (ref 6–23)
CO2: 30 meq/L (ref 19–32)
Calcium: 9.7 mg/dL (ref 8.4–10.5)
Chloride: 103 meq/L (ref 96–112)
Creatinine, Ser: 0.86 mg/dL (ref 0.40–1.20)
GFR: 77.13 mL/min (ref >60.00)
Glucose, Bld: 107 mg/dL — ABNORMAL HIGH (ref 70–99)
Potassium: 4 meq/L (ref 3.5–5.1)
Sodium: 140 meq/L (ref 135–145)

## 2023-10-02 LAB — TSH: TSH: 1.86 u[IU]/mL (ref 0.35–5.50)

## 2023-10-02 LAB — HEMOGLOBIN A1C: Hgb A1c MFr Bld: 6.3 % (ref 4.6–6.5)

## 2023-10-06 ENCOUNTER — Other Ambulatory Visit: Payer: Self-pay | Admitting: Internal Medicine

## 2023-10-08 ENCOUNTER — Encounter: Admitting: Internal Medicine

## 2023-10-15 ENCOUNTER — Encounter: Admitting: Internal Medicine

## 2023-10-30 LAB — HM MAMMOGRAPHY

## 2023-10-30 LAB — HM PAP SMEAR: HM Pap smear: NEGATIVE

## 2023-11-17 ENCOUNTER — Ambulatory Visit: Payer: Self-pay

## 2023-11-17 NOTE — Telephone Encounter (Signed)
 FYI Only or Action Required?: Action required by provider: clinical question for provider.  Patient was last seen in primary care on 07/02/2023 by Panosh, Apolinar POUR, MD.  Called Nurse Triage reporting Rash.  Symptoms began a week ago.  Interventions attempted: Nothing.  Symptoms are: unchanged.  Triage Disposition: See PCP When Office is Open (Within 3 Days)  Patient/caregiver understands and will follow disposition?: No, wishes to speak with PCP    Copied from CRM #[id number]. Topic: Clinical - Prescription Issue >> Nov 17, 2023  3:50 PM Drema MATSU wrote: Reason for CRM: Patient picked up valsartan -hydrochlorothiazide  (DIOVAN -HCT) 160-12.5 MG tablet from pharmacy and she stated that the packaging and pill was different but is the same type of medication. She stated that she has been taking round pill for a week and it caused her to break out, she is very dizzy, and she is very itchy. She wants to know if she can go back to the oval pill she was using. Thinks she is having a allergic reaction to it. Reason for Disposition  Rash started within 3 days after antibiotic stopped  Answer Assessment - Initial Assessment Questions 1. APPEARANCE of RASH: What does the rash look like? (e.g., spots, blisters, raised areas, skin peeling, scaly)     Pimples, small red  2. SIZE: How big are the spots? (e.g., tip of pen, eraser, coin; inches, centimeters)     Various sizes 3. LOCATION: Where is the rash located?     Legs, back, arms, etc 4. COLOR: What color is the rash? (Note: It is difficult to assess rash color in people with darker-colored skin. When this situation occurs, simply ask the caller to describe what they see.)     na 5. ONSET: When did the rash begin?     X 1 week  6. FEVER: Do you have a fever? If Yes, ask: What is your temperature, how was it measured, and when did it start?     no 7. ITCHING: Does the rash itch? If Yes, ask: How bad is the itch? (Scale 1-10; or  mild, moderate, severe)     moderate 8. CAUSE: What do you think is causing the rash?     moderate 9. NEW MEDICINES: What new medicines are you taking? (e.g., name of antibiotic) When did you start taking this medication?.     Yes new BP medication 10. OTHER SYMPTOMS: Do you have any other symptoms? (e.g., sore throat, fever, joint pain)       Na. 11. PREGNANCY: Is there any chance you are pregnant? When was your last menstrual period?       Na  Pt refused appt: pt states she would just like for me to send a message to PCP requesting to go back on old BP medication  Protocols used: Rash - Widespread On Drugs-A-AH

## 2023-11-24 ENCOUNTER — Ambulatory Visit: Payer: Self-pay

## 2023-11-24 NOTE — Telephone Encounter (Signed)
 FYI Only or Action Required?: FYI only for provider.  Patient was last seen in primary care on 07/02/2023 by Lauren Owens, Lauren POUR, MD.  Called Nurse Triage reporting Blood Pressure Medication Concerns.  Symptoms began few weeks ago when starting this different tablet of her usual blood pressure medication.  Interventions attempted: Rest, hydration, or home remedies and Other: patient did not take her blood pressure medication this morning and states she feels a lot better but needs to discuss blood pressure medication with a provider.  Symptoms are: her symptoms were getting worse until she stopped the blood pressure medicine this morning herself--states she feels a lot better.  Triage Disposition: See Physician Within 24 Hours  Patient/caregiver understands and will follow disposition?: Yes        Copied from CRM #[id number]. Topic: Clinical - Red Word Triage >> Nov 24, 2023 11:25 AM Lauren Owens wrote: Red Word that prompted transfer to Nurse Triage: Pt called in saying she has been experiencing rashes and hives since starting valsartan . Started experiencing blisters in mouth over the weekend. Warm transfer to [facility] nurse triage. Reason for Disposition  [1] Systolic BP 90-110 AND [2] taking blood pressure medications AND [3] NOT feeling weak or lightheaded  Answer Assessment - Initial Assessment Questions Patient states that she has started exercising more and has lost weight so she also doesn't know if this dosage might possibly be too high for her now Patient did not take her blood pressure medication this morning with her blood pressure at that time being 112/60 & she states she feels a lot better than when she was taking the medication She doesn't feel out of breath after walking now or like she is going to pass out (things she was feeling when taking the Valsartan -hydrochlorothiazide  Patient went to get a refill of her valsartan -hydrochlorothiazide  (DIOVAN -HCT) 160-12.5 MG The only  difference from this medication is that it is a round tablet instead of oval---possibly different manufacturer Patient states that she has a dry cough now---this has subsided some She has had blisters and used salt water rinse to help the one in her mouth Patient states that she feels very tired and is not liking how this medication is making her feel---she did not take it today  Patient is advised that if anything worsens to go to the Emergency Room. Patient verbalized understanding.     1. BLOOD PRESSURE: What is your blood pressure? Did you take at least two measurements 5 minutes apart?     Last night patient states her blood pressure was 90/57 This morning 112/60 2. ONSET: When did you take your blood pressure?     Last night and this morning 3. HOW: How did you take your blood pressure? (e.g., visiting nurse, automatic home BP monitor)     Cuff at home 4. HISTORY: Do you have a history of low blood pressure? What is your blood pressure normally?     No---high blood pressure history and takes medication 5. MEDICINES: Are you taking any medicines for blood pressure? If Yes, ask: Have they been changed recently?     Patient states no changes in medication dosages but this blood pressure medicine the last refill came in a different tablet form possibly from a different manufacturer and that's the only different thing she knows of  6. PULSE RATE: Do you know what your pulse rate is?      Patient states her heart rate is usually low around the 60s 7. OTHER SYMPTOMS: Have you been  sick recently? Have you had a recent injury?     Just tired  Protocols used: Blood Pressure - Low-A-AH

## 2023-11-25 ENCOUNTER — Telehealth: Payer: Self-pay

## 2023-11-25 ENCOUNTER — Ambulatory Visit: Admitting: Internal Medicine

## 2023-11-25 ENCOUNTER — Encounter: Payer: Self-pay | Admitting: Internal Medicine

## 2023-11-25 ENCOUNTER — Ambulatory Visit: Admitting: Family Medicine

## 2023-11-25 VITALS — BP 138/78 | HR 88 | Temp 98.8°F | Ht 62.0 in | Wt 164.4 lb

## 2023-11-25 DIAGNOSIS — T887XXA Unspecified adverse effect of drug or medicament, initial encounter: Secondary | ICD-10-CM

## 2023-11-25 DIAGNOSIS — R739 Hyperglycemia, unspecified: Secondary | ICD-10-CM | POA: Diagnosis not present

## 2023-11-25 DIAGNOSIS — I10 Essential (primary) hypertension: Secondary | ICD-10-CM

## 2023-11-25 DIAGNOSIS — Z79899 Other long term (current) drug therapy: Secondary | ICD-10-CM | POA: Diagnosis not present

## 2023-11-25 LAB — CBC WITH DIFFERENTIAL/PLATELET
Basophils Absolute: 0 K/uL (ref 0.0–0.1)
Basophils Relative: 0.5 % (ref 0.0–3.0)
Eosinophils Absolute: 0 K/uL (ref 0.0–0.7)
Eosinophils Relative: 0.4 % (ref 0.0–5.0)
HCT: 39.8 % (ref 36.0–46.0)
Hemoglobin: 12.9 g/dL (ref 12.0–15.0)
Lymphocytes Relative: 8.3 % — ABNORMAL LOW (ref 12.0–46.0)
Lymphs Abs: 0.6 K/uL — ABNORMAL LOW (ref 0.7–4.0)
MCHC: 32.5 g/dL (ref 30.0–36.0)
MCV: 85.8 fl (ref 78.0–100.0)
Monocytes Absolute: 0.7 K/uL (ref 0.1–1.0)
Monocytes Relative: 9.4 % (ref 3.0–12.0)
Neutro Abs: 5.9 K/uL (ref 1.4–7.7)
Neutrophils Relative %: 81.4 % — ABNORMAL HIGH (ref 43.0–77.0)
Platelets: 223 K/uL (ref 150.0–400.0)
RBC: 4.64 Mil/uL (ref 3.87–5.11)
RDW: 14.3 % (ref 11.5–15.5)
WBC: 7.3 K/uL (ref 4.0–10.5)

## 2023-11-25 LAB — BASIC METABOLIC PANEL WITH GFR
BUN: 8 mg/dL (ref 6–23)
CO2: 28 meq/L (ref 19–32)
Calcium: 9 mg/dL (ref 8.4–10.5)
Chloride: 105 meq/L (ref 96–112)
Creatinine, Ser: 0.82 mg/dL (ref 0.40–1.20)
GFR: 81.58 mL/min (ref >60.00)
Glucose, Bld: 127 mg/dL — ABNORMAL HIGH (ref 70–99)
Potassium: 3.8 meq/L (ref 3.5–5.1)
Sodium: 144 meq/L (ref 135–145)

## 2023-11-25 MED ORDER — AMLODIPINE BESYLATE 2.5 MG PO TABS: 2.5000 mg | ORAL_TABLET | Freq: Every day | ORAL | 3 refills | Status: AC

## 2023-11-25 NOTE — Patient Instructions (Addendum)
 I agree that  could be side effect .   So dont use what is in bottle .   Not sure if a manufacturer issue  or a pure medication issue side effect.   So wait to begin any thing else for bp  until the end of the week   or when all concern for symptoms are gone. and then can begin 2.5 mg amlodipine  per day  this is a very lose dose .   Repeat bp 138/78 today  Lab today . Keep  next appt  as planned

## 2023-11-25 NOTE — Telephone Encounter (Signed)
 Contacted pt's pharmacy and spoke to [facility], pharmacist. Inform him of pt's reports about the change on appearance of valsartan -hydrochlorothiazide  also pt reacting while on med. Ask him if there was a change on manufacturer.   He responded that since March, June until recent refill. There has been no change in manufacturer. Still with [employer] manufacture. He provided NDC [phone].  Forwarding to provider.

## 2023-11-25 NOTE — Progress Notes (Unsigned)
 Chief Complaint  Patient presents with   Medical Management of Chronic Issues    Pt reports reaction to BP- itching, rash, fatigue, dry cough. After stopping the BP med. Sx resolved except raspy voice. Pt states it is possibly due to changes to manufacturer. This time med feature is round. Took it on Sunday 8/10. Notice itchy, dizzy, fatigue on Monday. Blood blister on mouth. Check blood pressure while on med- 97/57. Stop taking it on this past Sunday.     HPI: NIEMAH SCHWEBKE 53 y.o. come in for Chronic disease management   On aug 11 and this was  different pill  itch the next day  and then   Hr low  and felt tired   then every day more dizzy  pulse was low but bp low 110 range  then blood bloster in mouth . And local  benadryl   Taled to nurse on  aug 18  and  Had been  walking  a lot and not feeling like self and  3 days ago  dry hacking cough  and no congestion.  But didn't stop until yesterday. .   Stopped med  2 days ago.  Pop up  bumps  all over took a week to go away.  ROS: See pertinent positives and negatives per HPI.  Past Medical History:  Diagnosis Date   Blood glucose elevated    borderline   Migraine    Physiological tremor    exagerated has used b blocker in past    Family History  Problem Relation Age of Onset   Hypertension Other    Gallbladder disease Mother    Heart failure Mother    Colon cancer Mother 56   Lung cancer Other    Coronary artery disease Father        in his 62's cabg x 5   Cancer Father    Colon polyps Neg Hx    Esophageal cancer Neg Hx    Rectal cancer Neg Hx    Stomach cancer Neg Hx     Social History   Socioeconomic History   Marital status: Married    Spouse name: Not on file   Number of children: Not on file   Years of education: Not on file   Highest education level: Not on file  Occupational History   Not on file  Tobacco Use   Smoking status: Never   Smokeless tobacco: Never  Vaping Use   Vaping status: Never  Used  Substance and Sexual Activity   Alcohol use: No   Drug use: No   Sexual activity: Not on file  Other Topics Concern   Not on file  Social History Narrative   Married   Second marriage   HH of 3 1 dog and 1 cat  fam animals    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    [employer]   Commuting from [place].    Has a farm and blended family   2 dog and GP   Drinks milk and lots of caffeine   Not regular exercise   Sleep 5 hours   Social Drivers of Health   Financial Resource Strain: Not on file  Food Insecurity: Not on file  Transportation Needs: Not on file  Physical Activity: Insufficiently Active (08/28/2022)   Exercise Vital Sign    Days of Exercise per Week: 1 day    Minutes of Exercise per Session: 30 min  Stress: No Stress Concern  Present (08/28/2022)   [employer] of Occupational Health - Occupational Stress Questionnaire    Feeling of Stress : Not at all  Social Connections: Socially Integrated (08/28/2022)   Social Connection and Isolation Panel    Frequency of Communication with Friends and Family: More than three times a week    Frequency of Social Gatherings with Friends and Family: Once a week    Attends Religious Services: More than 4 times per year    Active Member of [employer] or Organizations: Yes    Attends Engineer, structural: More than 4 times per year    Marital Status: Married    Outpatient Medications Prior to Visit  Medication Sig Dispense Refill   Cetirizine HCl (ZYRTEC PO) Take by mouth.     Multiple Vitamins-Minerals (CENTRUM WOMEN PO) Take 1 tablet by mouth daily.     valsartan -hydrochlorothiazide  (DIOVAN -HCT) 160-12.5 MG tablet TAKE 1 TABLET BY MOUTH EVERY DAY (Patient not taking: Reported on 11/25/2023) 30 tablet 3   Facility-Administered Medications Prior to Visit  Medication Dose Route Frequency Provider Last Rate Last Admin   0.9 %  sodium chloride  infusion  500 mL Intravenous Continuous Danis, Victory CROME III, MD          EXAM:  BP 138/78   Pulse 88   Temp 98.8 F (37.1 C) (Oral)   Ht 5' 2 (1.575 m)   Wt 164 lb 6.4 oz (74.6 kg)   SpO2 95%   BMI 30.07 kg/m   Body mass index is 30.07 kg/m. Repeat bp 138/78 sitting GENERAL: vitals reviewed and listed above, alert, oriented, appears well hydrated and in no acute distress HEENT: atraumatic, conjunctiva  clear, no obvious abnormalities on inspection of external nose and ears OP : no lesion edema or exudate  NECK: no obvious masses on inspection palpation  LUNGS: clear to auscultation bilaterally, no wheezes, rales or rhonchi, good air movement CV: HRRR, no clubbing cyanosis or  peripheral edema nl cap refill  MS: moves all extremities without noticeable focal  abnormality Skin:  no acute rash or edema  PSYCH: pleasant and cooperative, no obvious depression or anxiety Lab Results  Component Value Date   WBC 7.3 11/25/2023   HGB 12.9 11/25/2023   HCT 39.8 11/25/2023   PLT 223.0 11/25/2023   GLUCOSE 127 (H) 11/25/2023   CHOL 190 10/02/2023   TRIG 80.0 10/02/2023   HDL 55.80 10/02/2023   LDLCALC 118 (H) 10/02/2023   ALT 19 10/02/2023   AST 17 10/02/2023   NA 144 11/25/2023   K 3.8 11/25/2023   CL 105 11/25/2023   CREATININE 0.82 11/25/2023   BUN 8 11/25/2023   CO2 28 11/25/2023   TSH 1.86 10/02/2023   HGBA1C 6.3 10/02/2023   BP Readings from Last 3 Encounters:  11/25/23 138/78  09/12/23 124/75  07/02/23 128/85    ASSESSMENT AND PLAN:  Discussed the following assessment and plan:  Essential hypertension - Plan: Basic metabolic panel with GFR, CBC with Differential/Platelet  Medication management - Plan: Basic metabolic panel with GFR, CBC with Differential/Platelet  Hyperglycemia - Plan: Basic metabolic panel with GFR, CBC with Differential/Platelet  Side effect of medication ? - Plan: Basic metabolic panel with GFR, CBC with Differential/Platelet Noted sx after pill changed    call [facility]p pharmacy said same medication  however   shape of pill changed!   Combo pill  after adding hydrochlorothiazide ?   Not sure  so abundance of caution will stop valsartan  hydrochlorothiazide  and after days begin  amlodipine   And then fu  at next appt  in a week or so  Lab today  -Patient advised to return or notify health care team  if  new concerns arise.  Patient Instructions  I agree that  could be side effect .   So dont use what is in bottle .   Not sure if a manufacturer issue  or a pure medication issue side effect.   So wait to begin any thing else for bp  until the end of the week   or when all concern for symptoms are gone. and then can begin 2.5 mg amlodipine  per day  this is a very lose dose .   Repeat bp 138/78 today  Lab today . Keep  next appt  as planned   Rinoa Garramone K. Adelita Hone M.D.

## 2023-11-25 NOTE — Telephone Encounter (Signed)
 Interesting that the pill shape changed  at last refill .. patient said was oval and now round pink.

## 2023-11-26 ENCOUNTER — Ambulatory Visit: Payer: Self-pay | Admitting: Internal Medicine

## 2023-11-26 NOTE — Progress Notes (Signed)
 Kidney function ok and out of range in blood count  not concerning can be assoc with any acute reaction.  Blood sugar slightly up but this was not fasting   we will fu at next visit

## 2023-12-02 ENCOUNTER — Ambulatory Visit (INDEPENDENT_AMBULATORY_CARE_PROVIDER_SITE_OTHER): Admitting: Internal Medicine

## 2023-12-02 ENCOUNTER — Encounter: Payer: Self-pay | Admitting: Internal Medicine

## 2023-12-02 VITALS — BP 142/98 | HR 75 | Temp 98.0°F | Ht 62.5 in | Wt 164.0 lb

## 2023-12-02 DIAGNOSIS — R739 Hyperglycemia, unspecified: Secondary | ICD-10-CM

## 2023-12-02 DIAGNOSIS — Z0000 Encounter for general adult medical examination without abnormal findings: Secondary | ICD-10-CM | POA: Diagnosis not present

## 2023-12-02 DIAGNOSIS — I10 Essential (primary) hypertension: Secondary | ICD-10-CM | POA: Diagnosis not present

## 2023-12-02 DIAGNOSIS — Z79899 Other long term (current) drug therapy: Secondary | ICD-10-CM | POA: Diagnosis not present

## 2023-12-02 DIAGNOSIS — Z23 Encounter for immunization: Secondary | ICD-10-CM | POA: Diagnosis not present

## 2023-12-02 NOTE — Patient Instructions (Addendum)
 Good to see you today   Continue lifestyle intervention healthy eating and exercise .   Advise sweet tea withdrawal.  Blood sugar in in prediabetic range .   Begin the bp medication low dose  daily Plan rov in 3 months and we can recheck hg A1c at the office as well as BP readings .   Hep b  1 today, second can be done in 1 month or later .( Fu visit?)

## 2023-12-02 NOTE — Progress Notes (Signed)
 Chief Complaint  Patient presents with   Annual Exam    HPI: Patient  Lauren Owens  53 y.o. comes in today for Preventive Health Care visit   HT bp and poss se of medication  see last notes  Feels better   back to normally baseline  Hasn't begun new medication yet . ( Amlodipine )  No new sx   Health Maintenance  Topic Date Due   COVID-19 Vaccine (4 - 2025-26 season) 12/18/2023 (Originally 12/01/2023)   Cervical Cancer Screening (HPV/Pap Cotest)  03/02/2024 (Originally 07/31/2017)   INFLUENZA VACCINE  06/29/2024 (Originally 10/31/2023)   Pneumococcal Vaccine: 50+ Years (1 of 1 - PCV) 12/01/2024 (Originally 05/02/2020)   HIV Screening  12/01/2024 (Originally 05/02/1985)   Hepatitis B Vaccines 19-59 Average Risk (2 of 2 - CpG 2-dose series) 12/30/2023   DTaP/Tdap/Td (3 - Td or Tdap) 02/01/2024   MAMMOGRAM  08/18/2024   Colonoscopy  09/11/2028   Hepatitis C Screening  Completed   Zoster Vaccines- Shingrix   Completed   HPV VACCINES  Aged Out   Meningococcal B Vaccine  Aged Out   Health Maintenance Review LIFESTYLE:  Exercise:  walking now into 20 mintues  2 x per day  plus hour.  Tobacco/ETS: n Alcohol:   n Sugar beverages: usually coffee  one sweet tea a day .   No sodas  Sleep:    about 5-6 hours  Drug use: no HH of   3  one dog outside cat inside  Work:  8.5-9 hours   long day .    ROS:  GEN/ HEENT: No fever, significant weight changes sweats headaches vision problems hearing changes, CV/ PULM; No chest pain shortness of breath cough, syncope,edema  change in exercise tolerance. GI /GU: No adominal pain, vomiting, change in bowel habits. No blood in the stool. No significant GU symptoms. SKIN/HEME: ,no acute skin rashes suspicious lesions or bleeding. No lymphadenopathy, nodules, masses.  NEURO/ PSYCH:  No neurologic signs such as weakness numbness. No depression anxiety. IMM/ Allergy: No unusual infections.  Allergy .   REST of 12 system review negative except as per  HPI   Past Medical History:  Diagnosis Date   Blood glucose elevated    borderline   Migraine    Physiological tremor    exagerated has used b blocker in past    Past Surgical History:  Procedure Laterality Date   CESAREAN SECTION     CHOLECYSTECTOMY      Family History  Problem Relation Age of Onset   Hypertension Other    Gallbladder disease Mother    Heart failure Mother    Colon cancer Mother 60   Lung cancer Other    Coronary artery disease Father        in his 71's cabg x 5   Cancer Father    Colon polyps Neg Hx    Esophageal cancer Neg Hx    Rectal cancer Neg Hx    Stomach cancer Neg Hx     Social History   Socioeconomic History   Marital status: Married    Spouse name: Not on file   Number of children: Not on file   Years of education: Not on file   Highest education level: Not on file  Occupational History   Not on file  Tobacco Use   Smoking status: Never   Smokeless tobacco: Never  Vaping Use   Vaping status: Never Used  Substance and Sexual Activity   Alcohol use:  No   Drug use: No   Sexual activity: Not on file  Other Topics Concern   Not on file  Social History Narrative   Married   Second marriage   HH of 3 1 dog and 1 cat  fam animals    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    [employer]   Commuting from [place].    Has a farm and blended family   2 dog and GP   Drinks milk and lots of caffeine   Not regular exercise   Sleep 5 hours   Social Drivers of Health   Financial Resource Strain: Low Risk  (12/02/2023)   Overall Financial Resource Strain (CARDIA)    Difficulty of Paying Living Expenses: Not hard at all  Food Insecurity: No Food Insecurity (12/02/2023)   Hunger Vital Sign    Worried About Running Out of Food in the Last Year: Never true    Ran Out of Food in the Last Year: Never true  Transportation Needs: No Transportation Needs (12/02/2023)   PRAPARE - Administrator, Civil Service (Medical): No     Lack of Transportation (Non-Medical): No  Physical Activity: Insufficiently Active (12/02/2023)   Exercise Vital Sign    Days of Exercise per Week: 2 days    Minutes of Exercise per Session: 20 min  Stress: No Stress Concern Present (12/02/2023)   [employer] of Occupational Health - Occupational Stress Questionnaire    Feeling of Stress: Not at all  Social Connections: Socially Integrated (08/28/2022)   Social Connection and Isolation Panel    Frequency of Communication with Friends and Family: More than three times a week    Frequency of Social Gatherings with Friends and Family: Once a week    Attends Religious Services: More than 4 times per year    Active Member of [employer] or Organizations: Yes    Attends Engineer, structural: More than 4 times per year    Marital Status: Married    Outpatient Medications Prior to Visit  Medication Sig Dispense Refill   amLODipine  (NORVASC ) 2.5 MG tablet Take 1 tablet (2.5 mg total) by mouth daily. 30 tablet 3   Cetirizine HCl (ZYRTEC PO) Take by mouth.     Multiple Vitamins-Minerals (CENTRUM WOMEN PO) Take 1 tablet by mouth daily.     Facility-Administered Medications Prior to Visit  Medication Dose Route Frequency Provider Last Rate Last Admin   0.9 %  sodium chloride  infusion  500 mL Intravenous Continuous Danis, Victory CROME III, MD         EXAM:  BP (!) 142/98 (BP Location: Left Arm, Patient Position: Sitting, Cuff Size: Large)   Pulse 75   Temp 98 F (36.7 C) (Oral)   Ht 5' 2.5 (1.588 m)   Wt 164 lb (74.4 kg)   SpO2 96%   BMI 29.52 kg/m   Body mass index is 29.52 kg/m. Wt Readings from Last 3 Encounters:  12/02/23 164 lb (74.4 kg)  11/25/23 164 lb 6.4 oz (74.6 kg)  09/12/23 165 lb (74.8 kg)    Physical Exam: Vital signs reviewed HZW:Uypd is a well-developed well-nourished alert cooperative    who appearsr stated age in no acute distress.  HEENT: normocephalic atraumatic , Eyes: PERRL EOM's full, conjunctiva  clear, Nares: paten,t no deformity discharge or tenderness., Ears: no deformity EAC's clear TMs with normal landmarks. Mouth: clear OP, no lesions, edema.  Moist mucous membranes. Dentition in adequate repair. NECK: supple  without masses, thyromegaly or bruits. CHEST/PULM:  Clear to auscultation and percussion breath sounds equal no wheeze , rales or rhonchi. No chest wall deformities or tenderness. Breast: normal by inspection . No dimpling, discharge, masses, tenderness or discharge . CV: PMI is nondisplaced, S1 S2 no gallops, murmurs, rubs. Peripheral pulses are full without delay.  ABDOMEN: Bowel sounds normal nontender  No guard or rebound, no hepato splenomegal no CVA tenderness.  Extremtities:  No clubbing cyanosis or edema, no acute joint swelling or redness no focal atrophy NEURO:  Oriented x3, cranial nerves 3-12 appear to be intact, no obvious focal weakness,gait within normal limits no abnormal reflexes or asymmetrical SKIN: No acute rashes normal turgor, color, no bruising or petechiae. PSYCH: Oriented, good eye contact, no obvious depression anxiety, cognition and judgment appear normal. LN: no cervical axillary  adenopathy  Lab Results  Component Value Date   WBC 7.3 11/25/2023   HGB 12.9 11/25/2023   HCT 39.8 11/25/2023   PLT 223.0 11/25/2023   GLUCOSE 127 (H) 11/25/2023   CHOL 190 10/02/2023   TRIG 80.0 10/02/2023   HDL 55.80 10/02/2023   LDLCALC 118 (H) 10/02/2023   ALT 19 10/02/2023   AST 17 10/02/2023   NA 144 11/25/2023   K 3.8 11/25/2023   CL 105 11/25/2023   CREATININE 0.82 11/25/2023   BUN 8 11/25/2023   CO2 28 11/25/2023   TSH 1.86 10/02/2023   HGBA1C 6.3 10/02/2023    BP Readings from Last 3 Encounters:  12/02/23 (!) 142/98  11/25/23 138/78  09/12/23 124/75    Lab results reviewed with patient   ASSESSMENT AND PLAN:  Discussed the following assessment and plan:    ICD-10-CM   1. Visit for preventive health examination  Z00.00     2.  Essential hypertension  I10     3. Medication management  Z79.899     4. Hyperglycemia  R73.9    prediabetic range    5. Need for hepatitis B vaccination  Z23 Heplisav-B  (HepB-CPG) Vaccine    Optimize lsi  and disc  sugar avoidance  Hep B vaccine  today .  Begin med  see below .  ( Still not sure what happened but she felt good on valsartan  and then pill  changed and she didn't  told her to save bottles  etc and maybe we can figure out later  and not have to r/o valsartan  forever....) Return in about 3 months (around 03/02/2024) for bp and  hg a1c .  Patient Care Team: Keirstyn Aydt K, MD as PCP - General Mat Browning, MD as Attending Physician (Obstetrics and Gynecology) Patient Instructions  Good to see you today   Continue lifestyle intervention healthy eating and exercise .   Advise sweet tea withdrawal.  Blood sugar in in prediabetic range .   Begin the bp medication low dose  daily Plan rov in 3 months and we can recheck hg A1c at the office as well as BP readings .   Hep b  1 today, second can be done in 1 month or later .( Fu visit?)     Kitana Gage K. Kylen Ismael M.D.

## 2023-12-18 ENCOUNTER — Encounter: Payer: Self-pay | Admitting: Internal Medicine

## 2024-03-02 ENCOUNTER — Ambulatory Visit: Admitting: Internal Medicine

## 2024-03-02 ENCOUNTER — Encounter: Payer: Self-pay | Admitting: Internal Medicine

## 2024-03-02 VITALS — BP 150/90 | HR 85 | Temp 97.7°F | Ht 62.5 in | Wt 162.9 lb

## 2024-03-02 DIAGNOSIS — Z79899 Other long term (current) drug therapy: Secondary | ICD-10-CM

## 2024-03-02 DIAGNOSIS — R739 Hyperglycemia, unspecified: Secondary | ICD-10-CM

## 2024-03-02 DIAGNOSIS — I10 Essential (primary) hypertension: Secondary | ICD-10-CM | POA: Diagnosis not present

## 2024-03-02 LAB — POCT GLYCOSYLATED HEMOGLOBIN (HGB A1C): Hemoglobin A1C: 5.6 % (ref 4.0–5.6)

## 2024-03-02 NOTE — Patient Instructions (Signed)
 Good to see you today .  A1c is  much better .  Try taking the amlodipine  2.5 at night .  Check bp and pulse  readings  for next 7- 10 days and send in information so we can  make a decision  about bp and medication control.   And then next step .

## 2024-03-02 NOTE — Progress Notes (Signed)
 Chief Complaint  Patient presents with   Medical Management of Chronic Issues    HPI: Lauren Owens 53 y.o. come in forfu HT and blood sugar  prediabetes   HT BP   138  readings  take readings bid  much better when not working and off  such as ober holiday.   Took amlod 2.5  and ok but new bottle new generic and for a couple of day felt pounding heart  hours after taking in am . Not sure if was med but very sensitive to past hx of rex with valsartan  .thinks was med but not sure  didn't check bp or pulse during sx  A1c due today  less sugars  and walking    ROS: See pertinent positives and negatives per HPI. No current cp sob syncope  d  Past Medical History:  Diagnosis Date   Blood glucose elevated    borderline   Migraine    Physiological tremor    exagerated has used b blocker in past    Family History  Problem Relation Age of Onset   Hypertension Other    Gallbladder disease Mother    Heart failure Mother    Colon cancer Mother 54   Lung cancer Other    Coronary artery disease Father        in his 80's cabg x 5   Cancer Father    Colon polyps Neg Hx    Esophageal cancer Neg Hx    Rectal cancer Neg Hx    Stomach cancer Neg Hx     Social History   Socioeconomic History   Marital status: Married    Spouse name: Not on file   Number of children: Not on file   Years of education: Not on file   Highest education level: Not on file  Occupational History   Not on file  Tobacco Use   Smoking status: Never   Smokeless tobacco: Never  Vaping Use   Vaping status: Never Used  Substance and Sexual Activity   Alcohol use: No   Drug use: No   Sexual activity: Not on file  Other Topics Concern   Not on file  Social History Narrative   Married   Second marriage   HH of 3 1 dog and 1 cat  fam animals    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    [place]   Commuting from [place].    Has a farm and blended family   2 dog and GP   Drinks milk and  lots of caffeine   Not regular exercise   Sleep 5 hours   Social Drivers of Health   Financial Resource Strain: Low Risk  (12/02/2023)   Overall Financial Resource Strain (CARDIA)    Difficulty of Paying Living Expenses: Not hard at all  Food Insecurity: No Food Insecurity (12/02/2023)   Hunger Vital Sign    Worried About Running Out of Food in the Last Year: Never true    Ran Out of Food in the Last Year: Never true  Transportation Needs: No Transportation Needs (12/02/2023)   PRAPARE - Administrator, Civil Service (Medical): No    Lack of Transportation (Non-Medical): No  Physical Activity: Insufficiently Active (12/02/2023)   Exercise Vital Sign    Days of Exercise per Week: 2 days    Minutes of Exercise per Session: 20 min  Stress: No Stress Concern Present (12/02/2023)   [employer] of  Occupational Health - Occupational Stress Questionnaire    Feeling of Stress: Not at all  Social Connections: Socially Integrated (08/28/2022)   Social Connection and Isolation Panel    Frequency of Communication with Friends and Family: More than three times a week    Frequency of Social Gatherings with Friends and Family: Once a week    Attends Religious Services: More than 4 times per year    Active Member of [employer] or Organizations: Yes    Attends Engineer, Structural: More than 4 times per year    Marital Status: Married    Outpatient Medications Prior to Visit  Medication Sig Dispense Refill   Cetirizine HCl (ZYRTEC PO) Take by mouth.     Multiple Vitamins-Minerals (CENTRUM WOMEN PO) Take 1 tablet by mouth daily.     amLODipine  (NORVASC ) 2.5 MG tablet Take 1 tablet (2.5 mg total) by mouth daily. (Patient not taking: Reported on 03/02/2024) 30 tablet 3   Facility-Administered Medications Prior to Visit  Medication Dose Route Frequency Provider Last Rate Last Admin   0.9 %  sodium chloride  infusion  500 mL Intravenous Continuous Danis, Victory CROME III, MD          EXAM:  BP (!) 150/90   Pulse 85   Temp 97.7 F (36.5 C) (Oral)   Ht 5' 2.5 (1.588 m)   Wt 162 lb 14.4 oz (73.9 kg)   SpO2 98%   BMI 29.32 kg/m   Body mass index is 29.32 kg/m. Wt Readings from Last 3 Encounters:  03/02/24 162 lb 14.4 oz (73.9 kg)  12/02/23 164 lb (74.4 kg)  11/25/23 164 lb 6.4 oz (74.6 kg)    GENERAL: vitals reviewed and listed above, alert, oriented, appears well hydrated and in no acute distress HEENT: atraumatic, conjunctiva  clear, no obvious abnormalities on inspection of external nose and ears  NECK: no obvious masses on inspection palpation  LUNGS: clear to auscultation bilaterally, no wheezes, rales or rhonchi, good air movement CV: HRRR, no clubbing cyanosis or  peripheral edema nl cap refill  MS: moves all extremities without noticeable focal  abnormality PSYCH: pleasant and cooperative, no obvious depression or anxiety Lab Results  Component Value Date   WBC 7.3 11/25/2023   HGB 12.9 11/25/2023   HCT 39.8 11/25/2023   PLT 223.0 11/25/2023   GLUCOSE 127 (H) 11/25/2023   CHOL 190 10/02/2023   TRIG 80.0 10/02/2023   HDL 55.80 10/02/2023   LDLCALC 118 (H) 10/02/2023   ALT 19 10/02/2023   AST 17 10/02/2023   NA 144 11/25/2023   K 3.8 11/25/2023   CL 105 11/25/2023   CREATININE 0.82 11/25/2023   BUN 8 11/25/2023   CO2 28 11/25/2023   TSH 1.86 10/02/2023   HGBA1C 5.6 03/02/2024   BP Readings from Last 3 Encounters:  03/02/24 (!) 150/90  12/02/23 (!) 142/98  11/25/23 138/78    ASSESSMENT AND PLAN:  Discussed the following assessment and plan:  Essential hypertension  Hyperglycemia - Plan: POC HgB A1c  Medication management Poss se of med but maybe not    Bp up and down and stress triggers A1c is much better  Try again for 7-10 days send in bp and pulse readings  r/o low bp readings  If needed consider a b blocker or carvedilol   -Patient advised to return or notify health care team  if  new concerns arise.  Patient  Instructions  Good to see you today .  A1c is  much better .  Try taking the amlodipine  2.5 at night .  Check bp and pulse  readings  for next 7- 10 days and send in information so we can  make a decision  about bp and medication control.   And then next step .    [place]ak Rollyson K. Calum Cormier M.D.
